# Patient Record
Sex: Male | Born: 1970
Health system: Southern US, Community
[De-identification: ages and names within clinical notes are randomized; demographics above are authoritative.]

## PROBLEM LIST (undated history)

## (undated) DIAGNOSIS — K219 Gastro-esophageal reflux disease without esophagitis: Secondary | ICD-10-CM

## (undated) DIAGNOSIS — E119 Type 2 diabetes mellitus without complications: Secondary | ICD-10-CM

## (undated) DIAGNOSIS — E785 Hyperlipidemia, unspecified: Secondary | ICD-10-CM

## (undated) DIAGNOSIS — Z79899 Other long term (current) drug therapy: Secondary | ICD-10-CM

## (undated) DIAGNOSIS — E559 Vitamin D deficiency, unspecified: Secondary | ICD-10-CM

## (undated) DIAGNOSIS — I10 Essential (primary) hypertension: Secondary | ICD-10-CM

## (undated) HISTORY — DX: Gastro-esophageal reflux disease without esophagitis: K21.9

## (undated) HISTORY — DX: Other long term (current) drug therapy: Z79.899

## (undated) HISTORY — DX: Hyperlipidemia, unspecified: E78.5

## (undated) HISTORY — DX: Vitamin D deficiency, unspecified: E55.9

## (undated) HISTORY — DX: Essential (primary) hypertension: I10

## (undated) HISTORY — DX: Type 2 diabetes mellitus without complications: E11.9

---

## 2005-08-06 ENCOUNTER — Encounter: Admission: RE | Admit: 2005-08-06 | Discharge: 2005-09-03 | Payer: Self-pay | Admitting: Internal Medicine

## 2006-07-01 ENCOUNTER — Emergency Department (HOSPITAL_COMMUNITY): Admission: EM | Admit: 2006-07-01 | Discharge: 2006-07-02 | Payer: Self-pay | Admitting: Emergency Medicine

## 2007-04-22 HISTORY — PX: ANKLE SURGERY: SHX546

## 2007-09-25 ENCOUNTER — Observation Stay (HOSPITAL_COMMUNITY): Admission: EM | Admit: 2007-09-25 | Discharge: 2007-09-26 | Payer: Self-pay | Admitting: Emergency Medicine

## 2007-10-13 ENCOUNTER — Encounter: Admission: RE | Admit: 2007-10-13 | Discharge: 2008-01-11 | Payer: Self-pay | Admitting: Orthopedic Surgery
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
## 2011-01-16 LAB — POCT I-STAT, CHEM 8
BUN: 9
Calcium, Ion: 1.13
Chloride: 103
Creatinine, Ser: 1.4
Glucose, Bld: 166 — ABNORMAL HIGH
HCT: 44
Hemoglobin: 15
Potassium: 4.1
Sodium: 140
TCO2: 24

## 2011-07-09 ENCOUNTER — Other Ambulatory Visit: Payer: Self-pay

## 2011-07-09 ENCOUNTER — Emergency Department (HOSPITAL_COMMUNITY)
Admission: EM | Admit: 2011-07-09 | Discharge: 2011-07-10 | Disposition: A | Discharge status: Home Health | Payer: 59 | Attending: Emergency Medicine | Admitting: Emergency Medicine

## 2011-07-09 ENCOUNTER — Encounter (HOSPITAL_COMMUNITY): Payer: Self-pay | Admitting: *Deleted

## 2011-07-09 DIAGNOSIS — R12 Heartburn: Secondary | ICD-10-CM | POA: Insufficient documentation

## 2011-07-09 DIAGNOSIS — R110 Nausea: Secondary | ICD-10-CM | POA: Insufficient documentation

## 2011-07-09 DIAGNOSIS — R079 Chest pain, unspecified: Secondary | ICD-10-CM | POA: Insufficient documentation

## 2011-07-09 DIAGNOSIS — R1013 Epigastric pain: Secondary | ICD-10-CM | POA: Insufficient documentation

## 2011-07-10 ENCOUNTER — Emergency Department (HOSPITAL_COMMUNITY): Payer: 59

## 2011-07-10 LAB — CBC
MCH: 28.8 pg (ref 26.0–34.0)
MCV: 84.5 fL (ref 78.0–100.0)
Platelets: 280 10*3/uL (ref 150–400)
RBC: 5.34 MIL/uL (ref 4.22–5.81)
RDW: 12.5 % (ref 11.5–15.5)
WBC: 8.4 10*3/uL (ref 4.0–10.5)

## 2011-07-10 LAB — DIFFERENTIAL
Basophils Absolute: 0 10*3/uL (ref 0.0–0.1)
Basophils Relative: 1 % (ref 0–1)
Eosinophils Absolute: 0.1 10*3/uL (ref 0.0–0.7)
Eosinophils Relative: 1 % (ref 0–5)
Lymphs Abs: 3.5 10*3/uL (ref 0.7–4.0)
Neutrophils Relative %: 52 % (ref 43–77)

## 2011-07-10 LAB — COMPREHENSIVE METABOLIC PANEL
ALT: 49 U/L (ref 0–53)
AST: 31 U/L (ref 0–37)
Albumin: 4.4 g/dL (ref 3.5–5.2)
Alkaline Phosphatase: 55 U/L (ref 39–117)
Calcium: 9.8 mg/dL (ref 8.4–10.5)
GFR calc Af Amer: >90 mL/min (ref >90)
Potassium: 3.1 mEq/L — ABNORMAL LOW (ref 3.5–5.1)
Sodium: 137 mEq/L (ref 135–145)
Total Protein: 8.1 g/dL (ref 6.0–8.3)

## 2011-07-10 NOTE — ED Notes (Signed)
Patient transported to Ultrasound 

## 2011-07-10 NOTE — ED Provider Notes (Signed)
History     CSN: [id number]  Arrival date & time 07/09/11  2120   First MD Initiated Contact with Patient 07/10/11 0305      Chief Complaint  Patient presents with  . Abdominal Pain    pt c/o epigastric pain that comes and goes, reports recent diagnosis of acid reflux and no relief with home medications.   . Chest Pain    (Consider location/radiation/quality/duration/timing/severity/associated sxs/prior treatment) HPI Comments: Was given prilosec but not helping.    Heart cath about 5 years ago normal.  Patient is a 40 y.o. male presenting with abdominal pain and chest pain. The history is provided by the patient.  Abdominal Pain The primary symptoms of the illness include abdominal pain and nausea. The primary symptoms of the illness do not include fever, vomiting or diarrhea. Episode onset: 2 1/2 weeks ago. The onset of the illness was gradual. The problem has been gradually worsening.  The patient has not had a change in bowel habit. Additional symptoms associated with the illness include heartburn. Symptoms associated with the illness do not include chills.  Chest Pain Primary symptoms include abdominal pain and nausea. Pertinent negatives for primary symptoms include no fever and no vomiting.     History reviewed. No pertinent past medical history.  History reviewed. No pertinent past surgical history.  History reviewed. No pertinent family history.  History  Substance Use Topics  . Smoking status: Never Smoker   . Smokeless tobacco: Not on file  . Alcohol Use: Yes      Review of Systems  Constitutional: Negative for fever and chills.  Cardiovascular: Positive for chest pain.  Gastrointestinal: Positive for heartburn, nausea and abdominal pain. Negative for vomiting and diarrhea.  All other systems reviewed and are negative.    Allergies  Review of patient's allergies indicates no known allergies.  Home Medications   Current Outpatient Rx  Name Route  Sig Dispense Refill  . OMEPRAZOLE 20 MG PO CPDR Oral Take 20 mg by mouth daily.      BP 188/95  Pulse 82  Temp(Src) 98.2 F (36.8 C) (Oral)  Resp 18  Wt 200 lb (90.719 kg)  SpO2 99%  Physical Exam  Nursing note and vitals reviewed. Constitutional: He is oriented to person, place, and time. He appears well-developed and well-nourished. No distress.  HENT:  Head: Normocephalic and atraumatic.  Neck: Normal range of motion. Neck supple.  Cardiovascular: Normal rate and regular rhythm.   No murmur heard. Pulmonary/Chest: Effort normal and breath sounds normal. No respiratory distress.  Abdominal: Soft. Bowel sounds are normal. He exhibits no distension. There is no tenderness.  Musculoskeletal: Normal range of motion.  Neurological: He is alert and oriented to person, place, and time.  Skin: Skin is warm and dry. He is not diaphoretic.    ED Course  Procedures (including critical care time)   Labs Reviewed  TROPONIN I  CBC  DIFFERENTIAL  COMPREHENSIVE METABOLIC PANEL  LIPASE, BLOOD   No results found.   No diagnosis found.    MDM  The labs and ekg and [place] look okay.  I am unsure as to the nature of his symptoms, but does appear emergent.  He had a normal heart cath a few years ago and the symptoms do not sound cardiac.  I will discharge the patient to home.  To return prn.        Geoffery Lyons, MD 07/10/11 [phone]

## 2011-07-10 NOTE — Discharge Instructions (Signed)

## 2013-02-21 ENCOUNTER — Ambulatory Visit: Payer: Self-pay | Admitting: Podiatry

## 2013-06-20 ENCOUNTER — Encounter: Payer: 59 | Admitting: Internal Medicine

## 2013-06-20 ENCOUNTER — Ambulatory Visit: Payer: Self-pay | Admitting: Physician Assistant

## 2013-06-24 ENCOUNTER — Encounter: Payer: Self-pay | Admitting: Physician Assistant

## 2013-06-24 ENCOUNTER — Ambulatory Visit (INDEPENDENT_AMBULATORY_CARE_PROVIDER_SITE_OTHER): Payer: 59 | Admitting: Physician Assistant

## 2013-06-24 VITALS — BP 142/82 | HR 88 | Temp 97.7°F | Resp 16 | Ht 69.0 in | Wt 206.0 lb

## 2013-06-24 DIAGNOSIS — E785 Hyperlipidemia, unspecified: Secondary | ICD-10-CM

## 2013-06-24 DIAGNOSIS — Z79899 Other long term (current) drug therapy: Secondary | ICD-10-CM | POA: Insufficient documentation

## 2013-06-24 DIAGNOSIS — I10 Essential (primary) hypertension: Secondary | ICD-10-CM | POA: Insufficient documentation

## 2013-06-24 DIAGNOSIS — E559 Vitamin D deficiency, unspecified: Secondary | ICD-10-CM

## 2013-06-24 DIAGNOSIS — E119 Type 2 diabetes mellitus without complications: Secondary | ICD-10-CM

## 2013-06-24 DIAGNOSIS — K219 Gastro-esophageal reflux disease without esophagitis: Secondary | ICD-10-CM | POA: Insufficient documentation

## 2013-06-24 LAB — HEMOGLOBIN A1C
Hgb A1c MFr Bld: 10.1 % — ABNORMAL HIGH (ref <5.7)
Mean Plasma Glucose: 243 mg/dL — ABNORMAL HIGH (ref <117)

## 2013-06-24 LAB — CBC WITH DIFFERENTIAL/PLATELET
BASOS ABS: 0 10*3/uL (ref 0.0–0.1)
BASOS PCT: 0 % (ref 0–1)
EOS PCT: 0 % (ref 0–5)
Eosinophils Absolute: 0 10*3/uL (ref 0.0–0.7)
HCT: 43.5 % (ref 39.0–52.0)
Hemoglobin: 15.3 g/dL (ref 13.0–17.0)
LYMPHS PCT: 32 % (ref 12–46)
Lymphs Abs: 2.5 10*3/uL (ref 0.7–4.0)
MCH: 30.1 pg (ref 26.0–34.0)
MCHC: 35.2 g/dL (ref 30.0–36.0)
MCV: 85.5 fL (ref 78.0–100.0)
Monocytes Absolute: 0.5 10*3/uL (ref 0.1–1.0)
Monocytes Relative: 6 % (ref 3–12)
NEUTROS ABS: 4.9 10*3/uL (ref 1.7–7.7)
Neutrophils Relative %: 62 % (ref 43–77)
PLATELETS: 229 10*3/uL (ref 150–400)
RBC: 5.09 MIL/uL (ref 4.22–5.81)
RDW: 13.1 % (ref 11.5–15.5)
WBC: 7.9 10*3/uL (ref 4.0–10.5)

## 2013-06-24 MED ORDER — METFORMIN HCL ER 500 MG PO TB24: 1000.0000 mg | ORAL_TABLET | Freq: Two times a day (BID) | ORAL | Status: DC

## 2013-06-24 MED ORDER — ENALAPRIL MALEATE 20 MG PO TABS: 20.0000 mg | ORAL_TABLET | Freq: Every day | ORAL | Status: DC

## 2013-06-24 NOTE — Progress Notes (Signed)
HPI 43 y.o. male  presents for over due  follow up with hypertension, hyperlipidemia, diabetes and vitamin D. He has been noncompliant with his follow ups.  His blood pressure has not been controlled at home, today their BP is BP: 142/82 mmHg - did not take BP med this AM  He does not workout. He denies chest pain, shortness of breath, dizziness.  He is not on cholesterol medication and denies myalgias. His cholesterol is not at goal. The cholesterol last visit was: LDL 144  He has not been working on diet and exercise for Diabetes, he uses his mothers glucometer, 3 weeks ago 195, and denies foot ulcerations, hypoglycemia , increased appetite, nausea, polydipsia and but states he has had polyuria and some blurred vision.. Last A1C in the office was: 9.2 Patient is on Vitamin D supplement.    Current Medications:  Current Outpatient Prescriptions on File Prior to Visit  Medication Sig Dispense Refill  . omeprazole (PRILOSEC) 20 MG capsule Take 20 mg by mouth daily.       No current facility-administered medications on file prior to visit.   Medical History:  Past Medical History  Diagnosis Date  . Hypertension   . Hyperlipidemia   . Diabetes mellitus without complication   . GERD (gastroesophageal reflux disease)    Allergies: No Known Allergies   Review of Systems: [X]  = complains of  [ ]  = denies  General: Fatigue [ ]  Fever [ ]  Chills [ ]  Weakness [ ]   Insomnia [ ]  Eyes: Redness [ ]  Blurred vision [ ]  Diplopia [ ]   ENT: Congestion [ ]  Sinus Pain [ ]  Post Nasal Drip [ ]  Sore Throat [ ]  Earache [ ]   Cardiac: Chest pain/pressure [ ]  SOB [ ]  Orthopnea [ ]   Palpitations [ ]   Paroxysmal nocturnal dyspnea[ ]  Claudication [ ]  Edema [ ]   Pulmonary: Cough [ ]  Wheezing[ ]   SOB [ ]   Snoring [ ]   GI: Nausea [ ]  Vomiting[ ]  Dysphagia[ ]  Heartburn[ ]  Abdominal pain [ ]  Constipation [ ] ; Diarrhea [ ] ; BRBPR [ ]  Melena[ ]  GU: Hematuria[ ]  Dysuria [ ]  Nocturia[ ]  Urgency [ ]   Hesitancy [ ]  Discharge  [ ]  Neuro: Headaches[ ]  Vertigo[ ]  Paresthesias[ ]  Spasm [ ]  Speech changes [ ]  Incoordination [ ]   Ortho: Arthritis [ ]  Joint pain [ ]  Muscle pain [ ]  Joint swelling [ ]  Back Pain [ ]  Skin:  Rash [ ]   Pruritis [ ]  Change in skin lesion [ ]   Psych: Depression[ ]  Anxiety[ ]  Confusion [ ]  Memory loss [ ]   Heme/Lypmh: Bleeding [ ]  Bruising [ ]  Enlarged lymph nodes [ ]   Endocrine: Visual blurring Arly.Keller ] Paresthesia [ ]  Polyuria Arly.Keller ] Polydypsea [ ]    Heat/cold intolerance [ ]  Hypoglycemia [ ]   Family history- Review and unchanged Social history- Review and unchanged Physical Exam: Filed Vitals:   06/24/13 1048  BP: 142/82  Pulse: 88  Temp: 97.7 F (36.5 C)  Resp: 16   Wt Readings from Last 3 Encounters:  06/24/13 206 lb (93.441 kg)  07/09/11 200 lb (90.719 kg)   General Appearance: Well nourished, in no apparent distress. Eyes: PERRLA, EOMs, conjunctiva no swelling or erythema Sinuses: No Frontal/maxillary tenderness ENT/Mouth: Ext aud canals clear, TMs without erythema, bulging. No erythema, swelling, or exudate on post pharynx.  Tonsils not swollen or erythematous. Hearing normal.  Neck: Supple, thyroid normal.  Respiratory: Respiratory effort normal, BS equal bilaterally without rales, rhonchi,  wheezing or stridor.  Cardio: RRR with no MRGs. Brisk peripheral pulses without edema.  Abdomen: Soft, + BS.  Non tender, no guarding, rebound, hernias, masses. Lymphatics: Non tender without lymphadenopathy.  Musculoskeletal: Full ROM, 5/5 strength, normal gait.  Skin: Warm, dry without rashes, lesions, ecchymosis.  Neuro: Cranial nerves intact. No cerebellar symptoms. Sensation intact.  Psych: Awake and oriented X 3, normal affect, Insight and Judgment appropriate.   Assessment and Plan:  Hypertension: Restart medication, enalapril, monitor blood pressure at home. Continue DASH diet. Cholesterol: Continue diet and exercise. Check cholesterol. May need medications, will check level first,  goal 70. Diabetes-Start diet and exercise. Check A1C, restart MF, rules given and will likely need another medication.Discussed general issues about diabetes pathophysiology and management., Educational material distributed., Suggested low cholesterol diet., Encouraged aerobic exercise., Discussed foot care., Reminded to get yearly retinal exam. Given freestyle lite meter and scripts for strips.  Vitamin D Def- check level and continue medications.   Follow up in one month Continue diet and meds as discussed. Further disposition pending results of labs. Discussed med's effects and SE's.  OVER 40 minutes of exam, counseling, chart review   Quentin MullingCollier, Dax Murguia 11:11 AM

## 2013-06-24 NOTE — Patient Instructions (Addendum)
   Bad carbs also include fruit juice, alcohol, and sweet tea. These are empty calories that do not signal to your brain that you are full.   Please remember the good carbs are still carbs which convert into sugar. So please measure them out no more than 1/2-1 cup of rice, oatmeal, pasta, and beans.  Veggies are however free foods! Pile them on.   I like lean protein at every meal such as chicken, Malaw[place]urkey, pork chops, cottage cheese, etc. Just do not fry these meats and please center your meal around vegetable, the meats should be a side dish.   No all fruit is created equal. Please see the list below, the fruit at the bottom is higher in sugars than the fruit at the top   We want weight loss that will last so you should lose 1-2 pounds a week.  THAT IS IT! Please pick THREE things a month to change. Once it is a habit check off the item. Then pick another three items off the list to become habits.  If you are already doing a habit on the list GREAT!  Cross that item off! o Don't drink your calories. Ie, alcohol, soda, fruit juice, and sweet tea.  o Drink more water. Drink a glass when you feel hungry or before each meal.  o Eat breakfast - Complex carb and protein (likeDannon light and fit yogurt, oatmeal, fruit, eggs, [place]urkey bacon). o Measure your cereal.  Eat no more than one cup a day. (ie [place]ashi) o Eat an apple a day. o Add a vegetable a day. o Try a new vegetable a month. o Use Pam! Stop using oil or butter to cook. o Don't finish your plate or use smaller plates. o Share your dessert. o Eat sugar free Jello for dessert or frozen grapes. o Don't eat 2-3 hours before bed. o Switch to whole wheat bread, pasta, and brown rice. o Make healthier choices when you eat out. No fries! o Pick baked chicken, NOT fried. o Don't forget to SLOW DOWN when you eat. It is not going anywhere.  o Take the stairs. o Park far away in the parking lot o [employer] Farm[employer] soup cans (or weights) for 10 minutes while  watching TV. o Walk at work for 10 minutes during break. o Walk outside 1 time a week with your friend, kids, dog, or significant other. o Start a walking group at church. o Walk the mall as much as you can tolerate.  o Keep a food diary. o Weigh yourself daily. o Walk for 15 minutes 3 days per week. o Cook at home more often and eat out less.  If life happens and you go back to old habits, it is okay.  Just start over. You can do it!   If you experience chest pain, get short of breath, or tired during the exercise, please stop immediately and inform your doctor.   Your LDL is not in range. Your LDL is the bad cholesterol that can lead to heart attack and stroke. To lower your number you can decrease your fatty foods, red meat, cheese, milk and increase fiber like whole grains and veggies. You can also add a fiber supplement like Metamucil or Benefiber.

## 2013-06-25 LAB — LIPID PANEL
CHOL/HDL RATIO: 4.5 ratio
Cholesterol: 210 mg/dL — ABNORMAL HIGH (ref 0–200)
HDL: 47 mg/dL (ref >39)
LDL Cholesterol: 135 mg/dL — ABNORMAL HIGH (ref 0–99)
TRIGLYCERIDES: 141 mg/dL (ref <150)
VLDL: 28 mg/dL (ref 0–40)

## 2013-06-25 LAB — BASIC METABOLIC PANEL WITH GFR
BUN: 12 mg/dL (ref 6–23)
CHLORIDE: 97 meq/L (ref 96–112)
CO2: 28 mEq/L (ref 19–32)
CREATININE: 1 mg/dL (ref 0.50–1.35)
Calcium: 9.8 mg/dL (ref 8.4–10.5)
Glucose, Bld: 250 mg/dL — ABNORMAL HIGH (ref 70–99)
POTASSIUM: 4.3 meq/L (ref 3.5–5.3)
Sodium: 137 mEq/L (ref 135–145)

## 2013-06-25 LAB — TSH: TSH: 1.188 u[IU]/mL (ref 0.350–4.500)

## 2013-06-25 LAB — MAGNESIUM: MAGNESIUM: 1.8 mg/dL (ref 1.5–2.5)

## 2013-06-25 LAB — HEPATIC FUNCTION PANEL
ALBUMIN: 4.5 g/dL (ref 3.5–5.2)
ALK PHOS: 50 U/L (ref 39–117)
ALT: 35 U/L (ref 0–53)
AST: 23 U/L (ref 0–37)
BILIRUBIN TOTAL: 0.7 mg/dL (ref 0.2–1.2)
Bilirubin, Direct: 0.1 mg/dL (ref 0.0–0.3)
Indirect Bilirubin: 0.6 mg/dL (ref 0.2–1.2)
Total Protein: 7.6 g/dL (ref 6.0–8.3)

## 2013-06-25 LAB — INSULIN, FASTING: INSULIN FASTING, SERUM: 7 u[IU]/mL (ref 3–28)

## 2013-06-25 LAB — VITAMIN D 25 HYDROXY (VIT D DEFICIENCY, FRACTURES): VIT D 25 HYDROXY: 22 ng/mL — AB (ref 30–89)

## 2013-06-27 ENCOUNTER — Other Ambulatory Visit: Payer: Self-pay

## 2013-06-27 MED ORDER — SIMVASTATIN 40 MG PO TABS: 40.0000 mg | ORAL_TABLET | Freq: Every day | ORAL | Status: DC

## 2013-07-29 ENCOUNTER — Ambulatory Visit: Payer: Self-pay | Admitting: Physician Assistant

## 2013-10-16 ENCOUNTER — Other Ambulatory Visit: Payer: Self-pay | Admitting: Physician Assistant

## 2013-12-17 ENCOUNTER — Other Ambulatory Visit: Payer: Self-pay | Admitting: Emergency Medicine

## 2014-03-15 ENCOUNTER — Ambulatory Visit: Payer: Self-pay | Admitting: Physician Assistant

## 2014-04-18 ENCOUNTER — Ambulatory Visit: Payer: Self-pay | Admitting: Physician Assistant

## 2014-05-20 ENCOUNTER — Ambulatory Visit (INDEPENDENT_AMBULATORY_CARE_PROVIDER_SITE_OTHER): Payer: 59 | Admitting: Family Medicine

## 2014-05-20 ENCOUNTER — Ambulatory Visit (INDEPENDENT_AMBULATORY_CARE_PROVIDER_SITE_OTHER): Payer: 59

## 2014-05-20 VITALS — BP 120/86 | HR 87 | Temp 98.4°F | Resp 16 | Ht 69.0 in | Wt 202.4 lb

## 2014-05-20 DIAGNOSIS — M7989 Other specified soft tissue disorders: Secondary | ICD-10-CM

## 2014-05-20 DIAGNOSIS — M79642 Pain in left hand: Secondary | ICD-10-CM

## 2014-05-20 DIAGNOSIS — S62309A Unspecified fracture of unspecified metacarpal bone, initial encounter for closed fracture: Secondary | ICD-10-CM

## 2014-05-20 MED ORDER — MELOXICAM 15 MG PO TABS: 7.5000 mg | ORAL_TABLET | Freq: Every day | ORAL | Status: DC
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
## 2014-05-20 NOTE — Patient Instructions (Signed)
RICE: Routine Care for Injuries The routine care of many injuries includes Rest, Ice, Compression, and Elevation (RICE). HOME CARE INSTRUCTIONS  Rest is needed to allow your body to heal. Routine activities can usually be resumed when comfortable. Injured tendons and bones can take up to 6 weeks to heal. Tendons are the cord-like structures that attach muscle to bone.  Ice following an injury helps keep the swelling down and reduces pain.  Put ice in a plastic bag.  Place a towel between your skin and the bag.  Leave the ice on for 15-20 minutes, 3-4 times a day, or as directed by your health care provider. Do this while awake, for the first 24 to 48 hours. After that, continue as directed by your caregiver.  Compression helps keep swelling down. It also gives support and helps with discomfort. If an elastic bandage has been applied, it should be removed and reapplied every 3 to 4 hours. It should not be applied tightly, but firmly enough to keep swelling down. Watch fingers or toes for swelling, bluish discoloration, coldness, numbness, or excessive pain. If any of these problems occur, remove the bandage and reapply loosely. Contact your caregiver if these problems continue.  Elevation helps reduce swelling and decreases pain. With extremities, such as the arms, hands, legs, and feet, the injured area should be placed near or above the level of the heart, if possible. SEEK IMMEDIATE MEDICAL CARE IF:  You have persistent pain and swelling.  You develop redness, numbness, or unexpected weakness.  Your symptoms are getting worse rather than improving after several days. These symptoms may indicate that further evaluation or further X-rays are needed. Sometimes, X-rays may not show a small broken bone (fracture) until 1 week or 10 days later. Make a follow-up appointment with your caregiver. Ask when your X-ray results will be ready. Make sure you get your X-ray results. Document Released:  07/20/2000 Document Revised: 04/12/2013 Document Reviewed: 09/06/2010 Exit[employer] Patient Information 2015 [employer]. This information is not intended to replace advice given to you by your health care provider. Make sure you discuss any questions you have with your health care provider.  

## 2014-05-24 ENCOUNTER — Encounter (HOSPITAL_BASED_OUTPATIENT_CLINIC_OR_DEPARTMENT_OTHER): Payer: Self-pay | Admitting: *Deleted

## 2014-05-24 NOTE — Progress Notes (Signed)
Will need istat ekg-denies cardiac or resp problems

## 2014-05-25 ENCOUNTER — Ambulatory Visit (HOSPITAL_BASED_OUTPATIENT_CLINIC_OR_DEPARTMENT_OTHER)
Admission: RE | Admit: 2014-05-25 | Discharge: 2014-05-25 | Disposition: A | Discharge status: Home / Self Care | Payer: 59 | Source: Ambulatory Visit | Attending: Orthopedic Surgery | Admitting: Orthopedic Surgery

## 2014-05-25 ENCOUNTER — Ambulatory Visit (HOSPITAL_BASED_OUTPATIENT_CLINIC_OR_DEPARTMENT_OTHER): Payer: 59 | Admitting: Anesthesiology

## 2014-05-25 ENCOUNTER — Other Ambulatory Visit: Payer: Self-pay | Admitting: Orthopedic Surgery

## 2014-05-25 ENCOUNTER — Encounter (HOSPITAL_BASED_OUTPATIENT_CLINIC_OR_DEPARTMENT_OTHER)
Admission: RE | Disposition: A | Discharge status: Home / Self Care | Payer: Self-pay | Source: Ambulatory Visit | Attending: Orthopedic Surgery

## 2014-05-25 ENCOUNTER — Encounter (HOSPITAL_BASED_OUTPATIENT_CLINIC_OR_DEPARTMENT_OTHER): Payer: Self-pay | Admitting: *Deleted

## 2014-05-25 DIAGNOSIS — X58XXXA Exposure to other specified factors, initial encounter: Secondary | ICD-10-CM | POA: Insufficient documentation

## 2014-05-25 DIAGNOSIS — Y999 Unspecified external cause status: Secondary | ICD-10-CM | POA: Diagnosis not present

## 2014-05-25 DIAGNOSIS — I10 Essential (primary) hypertension: Secondary | ICD-10-CM | POA: Diagnosis not present

## 2014-05-25 DIAGNOSIS — K219 Gastro-esophageal reflux disease without esophagitis: Secondary | ICD-10-CM | POA: Insufficient documentation

## 2014-05-25 DIAGNOSIS — E785 Hyperlipidemia, unspecified: Secondary | ICD-10-CM | POA: Insufficient documentation

## 2014-05-25 DIAGNOSIS — E119 Type 2 diabetes mellitus without complications: Secondary | ICD-10-CM | POA: Diagnosis not present

## 2014-05-25 DIAGNOSIS — Y9389 Activity, other specified: Secondary | ICD-10-CM | POA: Insufficient documentation

## 2014-05-25 DIAGNOSIS — S62305A Unspecified fracture of fourth metacarpal bone, left hand, initial encounter for closed fracture: Secondary | ICD-10-CM | POA: Insufficient documentation

## 2014-05-25 DIAGNOSIS — Y929 Unspecified place or not applicable: Secondary | ICD-10-CM | POA: Diagnosis not present

## 2014-05-25 DIAGNOSIS — Z79899 Other long term (current) drug therapy: Secondary | ICD-10-CM | POA: Insufficient documentation

## 2014-05-25 HISTORY — PX: OPEN REDUCTION INTERNAL FIXATION (ORIF) METACARPAL: SHX6234

## 2014-05-25 LAB — POCT I-STAT, CHEM 8
BUN: 15 mg/dL (ref 6–23)
CALCIUM ION: 1.13 mmol/L (ref 1.12–1.23)
CHLORIDE: 101 mmol/L (ref 96–112)
Creatinine, Ser: 1 mg/dL (ref 0.50–1.35)
GLUCOSE: 244 mg/dL — AB (ref 70–99)
HEMATOCRIT: 49 % (ref 39.0–52.0)
Hemoglobin: 16.7 g/dL (ref 13.0–17.0)
Potassium: 3.6 mmol/L (ref 3.5–5.1)
SODIUM: 139 mmol/L (ref 135–145)
TCO2: 24 mmol/L (ref 0–100)

## 2014-05-25 LAB — GLUCOSE, CAPILLARY: GLUCOSE-CAPILLARY: 230 mg/dL — AB (ref 70–99)

## 2014-05-25 SURGERY — OPEN REDUCTION INTERNAL FIXATION (ORIF) METACARPAL
Anesthesia: General | Site: Finger | Laterality: Left

## 2014-05-25 MED ORDER — DEXAMETHASONE SODIUM PHOSPHATE 10 MG/ML IJ SOLN
INTRAMUSCULAR | Status: DC | PRN
  Administered 2014-05-25: 4 mg via INTRAVENOUS

## 2014-05-25 MED ORDER — OXYCODONE HCL 5 MG PO TABS: 5.0000 mg | ORAL_TABLET | Freq: Once | ORAL | Status: DC | PRN

## 2014-05-25 MED ORDER — MIDAZOLAM HCL 2 MG/2ML IJ SOLN
1.0000 mg | INTRAMUSCULAR | Status: DC | PRN
  Administered 2014-05-25: 2 mg via INTRAVENOUS

## 2014-05-25 MED ORDER — FENTANYL CITRATE 0.05 MG/ML IJ SOLN
50.0000 ug | INTRAMUSCULAR | Status: DC | PRN
  Administered 2014-05-25: 100 ug via INTRAVENOUS

## 2014-05-25 MED ORDER — OXYCODONE HCL 5 MG/5ML PO SOLN: 5.0000 mg | Freq: Once | ORAL | Status: DC | PRN

## 2014-05-25 MED ORDER — HYDROMORPHONE HCL 1 MG/ML IJ SOLN: 0.2500 mg | INTRAMUSCULAR | Status: DC | PRN

## 2014-05-25 MED ORDER — CHLORHEXIDINE GLUCONATE 4 % EX LIQD: 60.0000 mL | Freq: Once | CUTANEOUS | Status: DC

## 2014-05-25 MED ORDER — CEFAZOLIN SODIUM 1-5 GM-% IV SOLN
INTRAVENOUS | Status: AC
  Filled 2014-05-25: qty 100

## 2014-05-25 MED ORDER — ONDANSETRON HCL 4 MG/2ML IJ SOLN
INTRAMUSCULAR | Status: DC | PRN
  Administered 2014-05-25: 4 mg via INTRAVENOUS

## 2014-05-25 MED ORDER — MIDAZOLAM HCL 2 MG/2ML IJ SOLN
INTRAMUSCULAR | Status: AC
  Filled 2014-05-25: qty 2

## 2014-05-25 MED ORDER — CEFAZOLIN SODIUM-DEXTROSE 2-3 GM-% IV SOLR
2.0000 g | INTRAVENOUS | Status: AC
  Administered 2014-05-25: 2 g via INTRAVENOUS

## 2014-05-25 MED ORDER — HYDROCODONE-ACETAMINOPHEN 5-325 MG PO TABS: 1.0000 | ORAL_TABLET | Freq: Four times a day (QID) | ORAL | Status: DC | PRN

## 2014-05-25 MED ORDER — BUPIVACAINE-EPINEPHRINE (PF) 0.5% -1:200000 IJ SOLN
INTRAMUSCULAR | Status: DC | PRN
  Administered 2014-05-25: 25 mL via PERINEURAL

## 2014-05-25 MED ORDER — FENTANYL CITRATE 0.05 MG/ML IJ SOLN
INTRAMUSCULAR | Status: AC
  Filled 2014-05-25: qty 2

## 2014-05-25 MED ORDER — MIDAZOLAM HCL 5 MG/5ML IJ SOLN
INTRAMUSCULAR | Status: DC | PRN
  Administered 2014-05-25: 2 mg via INTRAVENOUS

## 2014-05-25 MED ORDER — LIDOCAINE HCL (CARDIAC) 20 MG/ML IV SOLN
INTRAVENOUS | Status: DC | PRN
  Administered 2014-05-25: 30 mg via INTRAVENOUS

## 2014-05-25 MED ORDER — FENTANYL CITRATE 0.05 MG/ML IJ SOLN
INTRAMUSCULAR | Status: AC
  Filled 2014-05-25: qty 6

## 2014-05-25 MED ORDER — LACTATED RINGERS IV SOLN
INTRAVENOUS | Status: DC
Start: 2014-05-25 — End: 2014-05-25
  Administered 2014-05-25 (×2): via INTRAVENOUS

## 2014-05-25 MED ORDER — ONDANSETRON HCL 4 MG/2ML IJ SOLN: 4.0000 mg | Freq: Once | INTRAMUSCULAR | Status: DC | PRN

## 2014-05-25 MED ORDER — PROPOFOL 10 MG/ML IV BOLUS
INTRAVENOUS | Status: DC | PRN
  Administered 2014-05-25: 200 mg via INTRAVENOUS

## 2014-05-25 SURGICAL SUPPLY — 52 items
BLADE MINI RND TIP GREEN BEAV (BLADE) IMPLANT
BLADE SURG 15 STRL LF DISP TIS (BLADE) ×1 IMPLANT
BLADE SURG 15 STRL SS (BLADE) ×2
BNDG CMPR 9X4 STRL LF SNTH (GAUZE/BANDAGES/DRESSINGS) ×1
BNDG COHESIVE 3X5 TAN STRL LF (GAUZE/BANDAGES/DRESSINGS) ×2 IMPLANT
BNDG ESMARK 4X9 LF (GAUZE/BANDAGES/DRESSINGS) ×2 IMPLANT
BNDG GAUZE ELAST 4 BULKY (GAUZE/BANDAGES/DRESSINGS) IMPLANT
CHLORAPREP W/TINT 26ML (MISCELLANEOUS) ×2 IMPLANT
CORDS BIPOLAR (ELECTRODE) ×2 IMPLANT
COVER BACK TABLE 60X90IN (DRAPES) ×2 IMPLANT
COVER MAYO STAND STRL (DRAPES) ×2 IMPLANT
CUFF TOURNIQUET SINGLE 18IN (TOURNIQUET CUFF) ×1 IMPLANT
DECANTER SPIKE VIAL GLASS SM (MISCELLANEOUS) IMPLANT
DRAPE EXTREMITY T 121X128X90 (DRAPE) ×2 IMPLANT
DRAPE OEC MINIVIEW 54X84 (DRAPES) ×2 IMPLANT
DRAPE SURG 17X23 STRL (DRAPES) ×2 IMPLANT
GAUZE SPONGE 4X4 12PLY STRL (GAUZE/BANDAGES/DRESSINGS) ×2 IMPLANT
GAUZE XEROFORM 1X8 LF (GAUZE/BANDAGES/DRESSINGS) ×2 IMPLANT
GLOVE BIO SURGEON STRL SZ7.5 (GLOVE) ×1 IMPLANT
GLOVE BIOGEL PI IND STRL 7.0 (GLOVE) IMPLANT
GLOVE BIOGEL PI IND STRL 8 (GLOVE) IMPLANT
GLOVE BIOGEL PI IND STRL 8.5 (GLOVE) ×1 IMPLANT
GLOVE BIOGEL PI INDICATOR 7.0 (GLOVE) ×1
GLOVE BIOGEL PI INDICATOR 8 (GLOVE) ×1
GLOVE BIOGEL PI INDICATOR 8.5 (GLOVE) ×1
GLOVE ECLIPSE 6.5 STRL STRAW (GLOVE) ×2 IMPLANT
GLOVE SURG ORTHO 8.0 STRL STRW (GLOVE) ×2 IMPLANT
GOWN STRL REUS W/ TWL LRG LVL3 (GOWN DISPOSABLE) IMPLANT
GOWN STRL REUS W/TWL LRG LVL3 (GOWN DISPOSABLE) ×2
GOWN STRL REUS W/TWL XL LVL3 (GOWN DISPOSABLE) ×3 IMPLANT
NDL PRECISIONGLIDE 27X1.5 (NEEDLE) IMPLANT
NEEDLE PRECISIONGLIDE 27X1.5 (NEEDLE) IMPLANT
NS IRRIG 1000ML POUR BTL (IV SOLUTION) ×2 IMPLANT
PACK BASIN DAY SURGERY FS (CUSTOM PROCEDURE TRAY) ×2 IMPLANT
PAD CAST 3X4 CTTN HI CHSV (CAST SUPPLIES) ×1 IMPLANT
PADDING CAST ABS 4INX4YD NS (CAST SUPPLIES) ×1
PADDING CAST ABS COTTON 4X4 ST (CAST SUPPLIES) ×1 IMPLANT
PADDING CAST COTTON 3X4 STRL (CAST SUPPLIES) ×2
SCREW CORTEX 1.5X8 (Screw) ×2 IMPLANT
SCREW CORTEX 1.5X9 (Screw) ×1 IMPLANT
SLEEVE SCD COMPRESS KNEE MED (MISCELLANEOUS) ×1 IMPLANT
SPLINT PLASTER CAST XFAST 3X15 (CAST SUPPLIES) IMPLANT
SPLINT PLASTER XTRA FASTSET 3X (CAST SUPPLIES) ×5
STOCKINETTE 4X48 STRL (DRAPES) ×2 IMPLANT
SUT CHROMIC 5 0 P 3 (SUTURE) ×1 IMPLANT
SUT MERSILENE 4 0 P 3 (SUTURE) IMPLANT
SUT MNCRL AB 4-0 PS2 18 (SUTURE) ×2 IMPLANT
SUT VICRYL 4-0 PS2 18IN ABS (SUTURE) ×1 IMPLANT
SYR BULB 3OZ (MISCELLANEOUS) ×2 IMPLANT
SYR CONTROL 10ML LL (SYRINGE) IMPLANT
TOWEL OR 17X24 6PK STRL BLUE (TOWEL DISPOSABLE) ×2 IMPLANT
UNDERPAD 30X30 INCONTINENT (UNDERPADS AND DIAPERS) ×2 IMPLANT

## 2014-05-25 NOTE — Anesthesia Procedure Notes (Addendum)
Anesthesia Regional Block:  Supraclavicular block  Pre-Anesthetic Checklist: ,, timeout performed, Correct Patient, Correct Site, Correct Laterality, Correct Procedure, Correct Position, site marked, Risks and benefits discussed,  Surgical consent,  Pre-op evaluation,  At surgeon's request and post-op pain management  Laterality: Left and Upper  Prep: chloraprep       Needles:  Injection technique: Single-shot  Needle Type: Echogenic Stimulator Needle     Needle Length: 5cm 5 cm Needle Gauge: 21 and 21 G    Additional Needles:  Procedures: ultrasound guided (picture in chart) Supraclavicular block Narrative:  Start time: 05/25/2014 1:27 PM End time: 05/25/2014 1:32 PM Injection made incrementally with aspirations every 5 mL.  Performed by: Personally  Anesthesiologist: CREWS, DAVID A   Procedure Name: LMA Insertion Date/Time: 05/25/2014 1:58 PM Performed by: Lynanne Delgreco Pre-anesthesia Checklist: Patient identified, Emergency Drugs available, Suction available and Patient being monitored Patient Re-evaluated:Patient Re-evaluated prior to inductionOxygen Delivery Method: Circle System Utilized Preoxygenation: Pre-oxygenation with 100% oxygen Intubation Type: IV induction Ventilation: Mask ventilation without difficulty LMA: LMA inserted LMA Size: 5.0 Number of attempts: 1 Airway Equipment and Method: Bite block Placement Confirmation: positive ETCO2 Tube secured with: Tape Dental Injury: Teeth and Oropharynx as per pre-operative assessment

## 2014-05-25 NOTE — Discharge Instructions (Addendum)
[facility] Center Instructions °Hand Surgery ° °Wound Care: °Keep your hand elevated above the level of your heart.  Do not allow it to dangle by your side.  Keep the dressing dry and do not remove it unless your doctor advises you to do so.  He will usually change it at the time of your post-op visit.  Moving your fingers is advised to stimulate circulation but will depend on the site of your surgery.  If you have a splint applied, your doctor will advise you regarding movement. ° °Activity: °Do not drive or operate machinery today.  Rest today and then you may return to your normal activity and work as indicated by your physician. ° °Diet:  °Drink liquids today or eat a light diet.  You may resume a regular diet tomorrow.   ° °General expectations: °Pain for two to three days. °Fingers may become slightly swollen. ° °Call your doctor if any of the following occur: °Severe pain not relieved by pain medication. °Elevated temperature. °Dressing soaked with blood. °Inability to move fingers. °White or bluish color to fingers. ° ° °Post Anesthesia Home Care Instructions ° °Activity: °Get plenty of rest for the remainder of the day. A responsible adult should stay with you for 24 hours following the procedure.  °For the next 24 hours, DO NOT: °-Drive a car °-Operate machinery °-Drink alcoholic beverages °-Take any medication unless instructed by your physician °-Make any legal decisions or sign important papers. ° °Meals: °Start with liquid foods such as gelatin or soup. Progress to regular foods as tolerated. Avoid greasy, spicy, heavy foods. If nausea and/or vomiting occur, drink only clear liquids until the nausea and/or vomiting subsides. Call your physician if vomiting continues. ° °Special Instructions/Symptoms: °Your throat may feel dry or sore from the anesthesia or the breathing tube placed in your throat during surgery. If this causes discomfort, gargle with warm salt water. The discomfort should disappear within 24  hours. ° ° °Regional Anesthesia Blocks ° °1. Numbness or the inability to move the "blocked" extremity may last from 3-48 hours after placement. The length of time depends on the medication injected and your individual response to the medication. If the numbness is not going away after 48 hours, call your surgeon. ° °2. The extremity that is blocked will need to be protected until the numbness is gone and the  Strength has returned. Because you cannot feel it, you will need to take extra care to avoid injury. Because it may be weak, you may have difficulty moving it or using it. You may not know what position it is in without looking at it while the block is in effect. ° °3. For blocks in the legs and feet, returning to weight bearing and walking needs to be done carefully. You will need to wait until the numbness is entirely gone and the strength has returned. You should be able to move your leg and foot normally before you try and bear weight or walk. You will need someone to be with you when you first try to ensure you do not fall and possibly risk injury. ° °4. Bruising and tenderness at the needle site are common side effects and will resolve in a few days. ° °5. Persistent numbness or new problems with movement should be communicated to the surgeon or the [facility] ([phone])/ [facility] ([phone]). °

## 2014-05-25 NOTE — Progress Notes (Signed)
  Assisted Dr. Crews with left, ultrasound guided, supraclavicular block. Side rails up, monitors on throughout procedure. See vital signs in flow sheet. Tolerated Procedure well. 

## 2014-05-25 NOTE — Anesthesia Preprocedure Evaluation (Signed)
Anesthesia Evaluation  Patient identified by MRN, date of birth, ID band Patient awake    Reviewed: Allergy & Precautions, NPO status , Patient's Chart, lab work & pertinent test results  Airway Mallampati: I  TM Distance: >3 FB Neck ROM: Full    Dental  (+) Teeth Intact, Dental Advisory Given   Pulmonary  breath sounds clear to auscultation        Cardiovascular hypertension, Pt. on medications Rhythm:Regular Rate:Normal     Neuro/Psych    GI/Hepatic GERD-  Medicated and Controlled,  Endo/Other  diabetes, Well Controlled, Type 2, Oral Hypoglycemic Agents  Renal/GU      Musculoskeletal   Abdominal   Peds  Hematology   Anesthesia Other Findings   Reproductive/Obstetrics                             Anesthesia Physical Anesthesia Plan  ASA: II  Anesthesia Plan: General   Post-op Pain Management:    Induction: Intravenous  Airway Management Planned: LMA  Additional Equipment:   Intra-op Plan:   Post-operative Plan: Extubation in OR  Informed Consent: I have reviewed the patients History and Physical, chart, labs and discussed the procedure including the risks, benefits and alternatives for the proposed anesthesia with the patient or authorized representative who has indicated his/her understanding and acceptance.   Dental advisory given  Plan Discussed with: CRNA, Anesthesiologist and Surgeon  Anesthesia Plan Comments:         Anesthesia Quick Evaluation

## 2014-05-25 NOTE — Op Note (Signed)
Dictation Number [id number]

## 2014-05-25 NOTE — Transfer of Care (Signed)
Immediate Anesthesia Transfer of Care Note  Patient: William Moran  Procedure(s) Performed: Procedure(s): OPEN REDUCTION INTERNAL FIXATION (ORIF) LEFT RING FINGER METACARPAL (Left)  Patient Location: PACU  Anesthesia Type:GA combined with regional for post-op pain  Level of Consciousness: awake, alert , oriented and patient cooperative  Airway & Oxygen Therapy: Patient Spontanous Breathing and Patient connected to face mask oxygen  Post-op Assessment: Report given to RN and Post -op Vital signs reviewed and stable  Post vital signs: Reviewed and stable  Last Vitals:  Filed Vitals:   05/25/14 1335  BP: 135/84  Pulse: 81  Temp:   Resp: 17    Complications: No apparent anesthesia complications

## 2014-05-25 NOTE — Brief Op Note (Signed)
05/25/2014  2:46 PM  PATIENT:  Royetta CarBernard Macneal  44 y.o. male  PRE-OPERATIVE DIAGNOSIS:  FRACTURE LEFT RING METACARPAL  POST-OPERATIVE DIAGNOSIS:  FRACTURE LEFT RING METACARPAL  PROCEDURE:  Procedure(s): OPEN REDUCTION INTERNAL FIXATION (ORIF) LEFT RING FINGER METACARPAL (Left)  SURGEON:  Surgeon(s) and Role:    * Cindee SaltGary Antino Mayabb, MD - Primary    * Betha LoaKevin Lahoma Constantin, MD - Assisting  PHYSICIAN ASSISTANT:   ASSISTANTS: K Donata Reddick,MD   ANESTHESIA:   regional and general  EBL:  Total I/O In: 1300 [I.V.:1300] Out: -   BLOOD ADMINISTERED:none  DRAINS: none   LOCAL MEDICATIONS USED:  NONE  SPECIMEN:  No Specimen  DISPOSITION OF SPECIMEN:  N/A  COUNTS:  YES  TOURNIQUET:   Total Tourniquet Time Documented: Upper Arm (Left) - 30 minutes Total: Upper Arm (Left) - 30 minutes   DICTATION: .Other Dictation: Dictation Number [id number]  PLAN OF CARE: Discharge to home after PACU  PATIENT DISPOSITION:  PACU - hemodynamically stable.
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
## 2014-05-26 ENCOUNTER — Encounter (HOSPITAL_BASED_OUTPATIENT_CLINIC_OR_DEPARTMENT_OTHER): Payer: Self-pay | Admitting: Orthopedic Surgery

## 2014-05-26 NOTE — Anesthesia Postprocedure Evaluation (Signed)
  Anesthesia Post-op Note  Patient: William Moran  Procedure(s) Performed: Procedure(s): OPEN REDUCTION INTERNAL FIXATION (ORIF) LEFT RING FINGER METACARPAL (Left)  Patient Location: PACU  Anesthesia Type: Generalwith regional   Level of Consciousness: awake, alert  and oriented  Airway and Oxygen Therapy: Patient Spontanous Breathing  Post-op Pain: none  Post-op Assessment: Post-op Vital signs reviewed  Post-op Vital Signs: Reviewed  Last Vitals:  Filed Vitals:   05/25/14 1542  BP: 156/95  Pulse: 75  Temp: 36.7 C  Resp: 18    Complications: No apparent anesthesia complications

## 2016-09-23 DIAGNOSIS — E1165 Type 2 diabetes mellitus with hyperglycemia: Secondary | ICD-10-CM | POA: Diagnosis not present

## 2016-09-23 DIAGNOSIS — E782 Mixed hyperlipidemia: Secondary | ICD-10-CM | POA: Diagnosis not present

## 2016-12-31 DIAGNOSIS — E782 Mixed hyperlipidemia: Secondary | ICD-10-CM | POA: Diagnosis not present

## 2016-12-31 DIAGNOSIS — E1165 Type 2 diabetes mellitus with hyperglycemia: Secondary | ICD-10-CM | POA: Diagnosis not present

## 2017-03-09 ENCOUNTER — Ambulatory Visit (HOSPITAL_COMMUNITY)
Admission: EM | Admit: 2017-03-09 | Discharge: 2017-03-09 | Disposition: A | Discharge status: Home / Self Care | Payer: 59 | Attending: Family Medicine | Admitting: Family Medicine

## 2017-03-09 ENCOUNTER — Encounter (HOSPITAL_COMMUNITY): Payer: Self-pay | Admitting: Emergency Medicine

## 2017-03-09 ENCOUNTER — Other Ambulatory Visit: Payer: Self-pay

## 2017-03-09 DIAGNOSIS — R040 Epistaxis: Secondary | ICD-10-CM

## 2017-03-09 DIAGNOSIS — J069 Acute upper respiratory infection, unspecified: Secondary | ICD-10-CM

## 2017-03-09 NOTE — ED Provider Notes (Signed)
  [facility]   [id number] 03/09/17 Arrival Time: 1055  ASSESSMENT & PLAN:  1. Epistaxis   2. Viral upper respiratory tract infection     OTC symptom care as needed. Recommend Claritin and nasal saline spray. May f/u as needed.  Reviewed expectations re: course of current medical issues. Questions answered. Outlined signs and symptoms indicating need for more acute intervention. Patient verbalized understanding. After Visit Summary given.   SUBJECTIVE:  William Moran is a 46 y.o. male who presents with complaint of nasal congestion and post-nasal drainage. Blowing nose more frequently. Onset abrupt approximately 3-4 days ago. Overall fatigued. SOB: none. Wheezing: none. Today noticed bright red blood on tissue when he blew nose. No copious or continuous nose bleed reported. "Nose kind of burns." No specific aggravating or alleviating factors reported. No h/o recurrent sinus infections.  OTC treatment: None.  ROS: As per HPI.   OBJECTIVE:  Vitals:   03/09/17 1156  BP: (!) 145/87  Pulse: 94  Temp: 98.4 F (36.9 C)  TempSrc: Oral  SpO2: 100%     General appearance: alert; no distress HEENT: nasal congestion; clear runny nose; throat irritation secondary to post-nasal drainage; nasal mucosa very irritated without active bleeding Neck: supple without LAD Lungs: clear to auscultation bilaterally Skin: warm and dry Psychological: alert and cooperative; normal mood and affect  No Known Allergies  Past Medical History:  Diagnosis Date  . Diabetes mellitus without complication (HCC)   . Encounter for long-term (current) use of other medications   . GERD (gastroesophageal reflux disease)   . Hyperlipidemia   . Hypertension   . Vitamin D deficiency    Social History   Socioeconomic History  . Marital status: Single    Spouse name: Not on file  . Number of children: Not on file  . Years of education: Not on file  . Highest education level: Not on  file  Social Needs  . Financial resource strain: Not on file  . Food insecurity - worry: Not on file  . Food insecurity - inability: Not on file  . Transportation needs - medical: Not on file  . Transportation needs - non-medical: Not on file  Occupational History  . Not on file  Tobacco Use  . Smoking status: Never Smoker  . Smokeless tobacco: Never Used  Substance and Sexual Activity  . Alcohol use: Yes  . Drug use: No  . Sexual activity: Not on file  Other Topics Concern  . Not on file  Social History Narrative  . Not on file   Family History  Problem Relation Age of Onset  . Diabetes Mother            Mardella LaymanHagler, Druanne Bosques, MD 03/09/17 1236

## 2017-03-09 NOTE — Discharge Instructions (Signed)
You may use over the counter Claritin and any nasal saline spray.

## 2017-03-09 NOTE — ED Triage Notes (Signed)
Pt reports seeing blood on his kleenex when he blows his nose that started this morning.  Pt states he has been having trouble with his allergies over the last three days, with nasal congestion and drainage and post nasal drip.  He reports some burning in the nares and sinus cavities.

## 2017-10-12 DIAGNOSIS — E782 Mixed hyperlipidemia: Secondary | ICD-10-CM | POA: Diagnosis not present

## 2017-10-12 DIAGNOSIS — E1165 Type 2 diabetes mellitus with hyperglycemia: Secondary | ICD-10-CM | POA: Diagnosis not present

## 2017-10-19 DIAGNOSIS — E1165 Type 2 diabetes mellitus with hyperglycemia: Secondary | ICD-10-CM | POA: Diagnosis not present

## 2017-10-19 DIAGNOSIS — I10 Essential (primary) hypertension: Secondary | ICD-10-CM | POA: Diagnosis not present

## 2017-10-19 DIAGNOSIS — E782 Mixed hyperlipidemia: Secondary | ICD-10-CM | POA: Diagnosis not present

## 2017-10-19 DIAGNOSIS — Z0000 Encounter for general adult medical examination without abnormal findings: Secondary | ICD-10-CM | POA: Diagnosis not present

## 2018-01-26 ENCOUNTER — Other Ambulatory Visit: Payer: Self-pay | Admitting: Occupational Medicine

## 2018-01-26 ENCOUNTER — Ambulatory Visit: Payer: Self-pay

## 2018-01-26 DIAGNOSIS — M545 Low back pain, unspecified: Secondary | ICD-10-CM

## 2018-01-28 MED FILL — predniSONE 10 MG TABS: 10 | 12 days supply | Qty: 48 | Fill #0

## 2018-02-10 ENCOUNTER — Ambulatory Visit: Payer: PRIVATE HEALTH INSURANCE | Attending: Occupational Medicine | Admitting: Physical Therapy

## 2018-02-10 ENCOUNTER — Other Ambulatory Visit: Payer: Self-pay

## 2018-02-10 ENCOUNTER — Encounter: Payer: Self-pay | Admitting: Physical Therapy

## 2018-02-10 DIAGNOSIS — M545 Low back pain, unspecified: Secondary | ICD-10-CM

## 2018-02-10 NOTE — Therapy (Signed)
[facility] [address] [place], [place], [address] Phone: [phone]   Fax:  [phone]  Physical Therapy Evaluation  Patient Details  Name: William Moran MRN: [id number] Date of Birth: 03-May-1970 Referring Provider (PT): Lucia Gaskins, MD   Encounter Date: 02/10/2018  PT End of Session - 02/10/18 0934    Visit Number  1    Number of Visits  7    Authorization Type  [facility] 1 eval + 6 visits, Case Manager Idamae Lusher  **fax notes**    Authorization - Visit Number  1    Authorization - Number of Visits  7    PT Start Time  0930    PT Stop Time  1008    PT Time Calculation (min)  38 min    Activity Tolerance  Patient tolerated treatment well    Behavior During Therapy  [facility] for tasks assessed/performed       Past Medical History:  Diagnosis Date  . Diabetes mellitus without complication (HCC)   . Encounter for long-term (current) use of other medications   . GERD (gastroesophageal reflux disease)   . Hyperlipidemia   . Hypertension   . Vitamin D deficiency     Past Surgical History:  Procedure Laterality Date  . ANKLE SURGERY Right 2009  . OPEN REDUCTION INTERNAL FIXATION (ORIF) METACARPAL Left 05/25/2014   Procedure: OPEN REDUCTION INTERNAL FIXATION (ORIF) LEFT RING FINGER METACARPAL;  Surgeon: Cindee Salt, MD;  Location: [facility];  Service: Orthopedics;  Laterality: Left;    There were no vitals filed for this visit.   Subjective Assessment - 02/10/18 0938    Subjective  Having pain in Lt lower back to knee- the whole thigh. 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 Oct 1st was incident. Not working now, returns to MD on Oct 30.     Patient Stated Goals  sleep, work    Currently in Pain?  Yes    Pain Score  7     Pain Location  Leg    Pain Orientation  Right;Upper    Pain Descriptors / Indicators  Throbbing    Pain Type  Acute pain    Pain Radiating Towards  Lt knee     Pain Frequency  Intermittent    Aggravating Factors   laying down, being still    Pain Relieving Factors  moving around- shorter periods         [facility] PT Assessment - 02/10/18 0001      Assessment   Medical Diagnosis  LBP    Referring Provider (PT)  Lucia Gaskins, MD    Onset Date/Surgical Date  01/19/18    Hand Dominance  Left    Next MD Visit  10/30    Prior Therapy  no      Precautions   Precautions  None      Restrictions   Weight Bearing Restrictions  No      Balance Screen   Has the patient fallen in the past 6 months  No      Home Environment   Living Environment  Private residence      Prior Function   Level of Independence  Independent    Vocation  Full time employment    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      Cognition   Overall Cognitive Status  Within Functional Limits for tasks assessed      Observation/Other Assessments  Focus on Therapeutic Outcomes (FOTO)   46% limited      Sensation   Additional Comments  WFL      ROM / Strength   AROM / PROM / Strength  Strength      Strength   Strength Assessment Site  Hip    Right/Left Hip  Right;Left    Right Hip Flexion  5/5    Right Hip ABduction  5/5    Left Hip Flexion  4/5    Left Hip ABduction  4-/5      Flexibility   Soft Tissue Assessment /Muscle Length  yes    Hamstrings  50% on Lt vs Rt, concordant pulling      Palpation   Palpation comment  TTP gluts, piriformis, TFL on Lt                Objective measurements completed on examination: See above findings.      [facility] Adult PT Treatment/Exercise - 02/10/18 0001      Exercises   Exercises  Knee/Hip      Knee/Hip Exercises: Stretches   Passive Hamstring Stretch Limitations  supine with strap    Quad Stretch Limitations  standing    Hip Flexor Stretch Limitations  modified thomas    Piriformis Stretch Limitations  figure 4    Gastroc Stretch Limitations  standing             PT Education - 02/10/18  0949    Education Details  tennis ball STM, anatomy of condition, POC, HEP, exercise form/rationale    Person(s) Educated  Patient    Methods  Explanation;Demonstration;Tactile cues;Verbal cues;Handout    Comprehension  Verbalized understanding;Returned demonstration;Verbal cues required;Tactile cues required;Need further instruction          PT Long Term Goals - 02/10/18 1010      PT LONG TERM GOAL #1   Title  Pain <=2/10 with functional walking and lifting    Baseline  7/10 at eval    Time  3    Period  Weeks    Status  New    Target Date  03/05/18      PT LONG TERM GOAL #2   Title  Pt will demo proper form with patient bed mobility     Baseline  will educate as appropriate    Time  3    Period  Weeks    Status  New    Target Date  03/05/18      PT LONG TERM GOAL #3   Title  pt will demo proper form with pushing/pulling weighted sled to mimick patient beds    Baseline  will educate as appropriate    Time  3    Period  Weeks    Status  New    Target Date  03/05/18      PT LONG TERM GOAL #4   Title  pt will demo 5/5 hip strength    Baseline  see flowsheet    Time  3    Period  Weeks    Status  New    Target Date  03/05/18      PT LONG TERM GOAL #5   Title  pt will be independent in long term stretching and exercise program for further protection of back.     Baseline  will progress and establish    Time  3    Period  Weeks    Status  New    Target Date  03/05/18             Plan - 02/10/18 1007    Clinical Impression Statement  Pt presents to PT with complaints of pain in Lt lower back into buttock that extends to Lt knee as a toothache sensation. Notable limitations in flexibility and strength in Lt hip vs Rt. 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 Will benefit from skilled PT in order to decrease pain and reach functional goals. Pt is not working at this time but I requested that he return to walking with  short distances at first and progress as toelrated.     Clinical Presentation  Stable    Clinical Decision Making  Low    Rehab Potential  Good    PT Frequency  2x / week    PT Duration  3 weeks    PT Treatment/Interventions  ADLs/Self Care Home Management;Cryotherapy;Electrical Stimulation;Ultrasound;Traction;Moist Heat;Therapeutic activities;Therapeutic exercise;Manual techniques;Patient/family education;Passive range of motion;Dry needling;Taping    PT Next Visit Plan  STM/DN PRN, core activation, review stretches, how did return to walking go?    PT Home Exercise Plan  stretches: HS, quads/hip flexors, piriformis, gastroc/soleus    Consulted and Agree with Plan of Care  Patient       Patient will benefit from skilled therapeutic intervention in order to improve the following deficits and impairments:  Difficulty walking, Increased muscle spasms, Decreased activity tolerance, Pain, Improper body mechanics, Impaired flexibility, Decreased strength  Visit Diagnosis: Acute left-sided low back pain without sciatica - Plan: PT plan of care cert/re-cert     Problem List Patient Active Problem List   Diagnosis Date Noted  . Hypertension   . Hyperlipidemia   . Diabetes mellitus without complication (HCC)   . GERD (gastroesophageal reflux disease)   . Vitamin D deficiency   . Encounter for long-term (current) use of other medications    Tino Ronan C. Kevonna Nolte PT, DPT 02/10/18 11:10 AM   [facility] [address] [place], [place], [address] Phone: [phone]   Fax:  [phone]  Name: Evander Macaraeg MRN: [id number] Date of Birth: [birth year]

## 2018-02-11 ENCOUNTER — Ambulatory Visit: Payer: PRIVATE HEALTH INSURANCE | Attending: Occupational Medicine | Admitting: Physical Therapy

## 2018-02-11 ENCOUNTER — Encounter: Payer: Self-pay | Admitting: Physical Therapy

## 2018-02-11 DIAGNOSIS — M545 Low back pain, unspecified: Secondary | ICD-10-CM

## 2018-02-11 NOTE — Therapy (Signed)
[facility] Outpatient [facility] [address] [place], [place], [address] Phone: [phone]   Fax:  [phone]  Physical Therapy Treatment  Patient Details  Name: William Moran MRN: [id number] Date of Birth: June 14, 1970 Referring Provider (PT): Lucia Gaskins, MD   Encounter Date: 02/11/2018  PT End of Session - 02/11/18 1320    Visit Number  2    Number of Visits  7    Authorization Type  [facility] 1 eval + 6 visits, Case Manager Idamae Lusher  **fax notes**    Authorization - Visit Number  1    Authorization - Number of Visits  7    PT Start Time  1145    PT Stop Time  1245    PT Time Calculation (min)  60 min    Activity Tolerance  Patient tolerated treatment well    Behavior During Therapy  [facility] for tasks assessed/performed       Past Medical History:  Diagnosis Date  . Diabetes mellitus without complication (HCC)   . Encounter for long-term (current) use of other medications   . GERD (gastroesophageal reflux disease)   . Hyperlipidemia   . Hypertension   . Vitamin D deficiency     Past Surgical History:  Procedure Laterality Date  . ANKLE SURGERY Right 2009  . OPEN REDUCTION INTERNAL FIXATION (ORIF) METACARPAL Left 05/25/2014   Procedure: OPEN REDUCTION INTERNAL FIXATION (ORIF) LEFT RING FINGER METACARPAL;  Surgeon: Cindee Salt, MD;  Location: [facility];  Service: Orthopedics;  Laterality: Left;    There were no vitals filed for this visit.  Subjective Assessment - 02/11/18 1142    Subjective  HAS DONE the exercises.  Walked 15 minutes pain 5/10 paved street flat and hill.  Pain initially  2-3/10.  Sleeping on right side( with pillow0 vs stomach    Currently in Pain?  Yes    Pain Score  3     Pain Location  Back    Pain Orientation  Left;Lower    Pain Radiating Towards  buttocks  anterior thigh proximal.     Pain Frequency  Intermittent    Aggravating Factors   laying down, being still walking/ standing longer    Pain  Relieving Factors  moving around,  exetcises, heat    Effect of Pain on Daily Activities  Not working,  limits walking, ADL limited.   Not back to the gym     Multiple Pain Sites  No                       [facility] Adult PT Treatment/Exercise - 02/11/18 0001      Self-Care   Self-Care  Other Self-Care Comments    Other Self-Care Comments   Anatomy hip flexor,  piriformis model used      Lumbar Exercises: Stretches   Passive Hamstring Stretch  3 reps;30 seconds    Passive Hamstring Stretch Limitations  cues for muscle stretch only vs nerve pain    Pelvic Tilt  5 reps    Pelvic Tilt Limitations  small motions,  mod cues needed    Piriformis Stretch  3 reps;30 seconds    Figure 4 Stretch  3 reps;30 seconds    Gastroc Stretch  3 reps;30 seconds    Gastroc Stretch Limitations  incline,  toes on      Lumbar Exercises: Supine   Clam Limitations  3 sets both and single,  cued    Heel Slides  5 reps    Heel Slides Limitations  fatigues       Knee/Hip Exercises: Supine   Bridges  10 reps    Bridges Limitations  moves slow and guarded.       Moist Heat Therapy   Number Minutes Moist Heat  10 Minutes    Moist Heat Location  Lumbar Spine      Manual Therapy   Manual Therapy  Soft tissue mobilization    Manual therapy comments  left low back, gluteal mid,  light pressure,  trigger point release     proximal gluteal, medially            PT Education - 02/11/18 1255    Education Details  anatomy,  HEP    Person(s) Educated  Patient    Methods  Explanation;Demonstration;Tactile cues;Verbal cues;Handout    Comprehension  Returned demonstration;Verbalized understanding          PT Long Term Goals - 02/10/18 1010      PT LONG TERM GOAL #1   Title  Pain <=2/10 with functional walking and lifting    Baseline  7/10 at eval    Time  3    Period  Weeks    Status  New    Target Date  03/05/18      PT LONG TERM GOAL #2   Title  Pt will demo proper form with  patient bed mobility     Baseline  will educate as appropriate    Time  3    Period  Weeks    Status  New    Target Date  03/05/18      PT LONG TERM GOAL #3   Title  pt will demo proper form with pushing/pulling weighted sled to mimick patient beds    Baseline  will educate as appropriate    Time  3    Period  Weeks    Status  New    Target Date  03/05/18      PT LONG TERM GOAL #4   Title  pt will demo 5/5 hip strength    Baseline  see flowsheet    Time  3    Period  Weeks    Status  New    Target Date  03/05/18      PT LONG TERM GOAL #5   Title  pt will be independent in long term stretching and exercise program for further protection of back.     Baseline  will progress and establish    Time  3    Period  Weeks    Status  New    Target Date  03/05/18            Plan - 02/11/18 1320    Clinical Impression Statement  Patrient was able to progress the HEP today.  Exercises hepl to decrease pain.  Pain is beginning to centralize.  patient was able to return to walking 15 minutes limited by pain.     PT Next Visit Plan  STM/DN PRN, core activation, review stretches, how did return to walking go?    PT Home Exercise Plan  stretches: HS, quads/hip flexors, piriformis, gastroc/soleus,  Abdominal bracing with Clams both and single,  heel slides, and marching.     Consulted and Agree with Plan of Care  Patient       Patient will benefit from skilled therapeutic intervention in order to improve the following deficits and impairments:  Visit Diagnosis: Acute left-sided low back pain without sciatica     Problem List Patient Active Problem List   Diagnosis Date Noted  . Hypertension   . Hyperlipidemia   . Diabetes mellitus without complication (HCC)   . GERD (gastroesophageal reflux disease)   . Vitamin D deficiency   . Encounter for long-term (current) use of other medications     Teoman Giraud PTA 02/11/2018, 1:25 PM  [facility] [address] [place], [place], [address] Phone: [phone]   Fax:  [phone]  Name: Kline Bulthuis MRN: [id number] Date of Birth: 1970-05-02

## 2018-02-11 NOTE — Patient Instructions (Addendum)
Bridging    Slowly raise buttocks from floor, keeping stomach tight. Repeat __10__ times per set. Do _1-2___ sets per session. Do _1-2___ sessions per day.  http://orth.exer.us/1096   Copyright  VHI. All rights reserved.  Pelvic Tilt    Flatten back by tightening stomach muscles and buttocks. Repeat ___10_ times per set. Do ___1_ sets per session. Do ___1-2 sessions per day.           Heel Slide to Straight    Slide one leg down to straight. Return. Be sure pelvis does not rock forward, tilt, rotate, or tip to side. Do _5__ times. Restabilize pelvis. Repeat with other leg. Do _1-2__ sets, _1-2__ times per day.  http://ss.exer.us/16   Copyright  VHI. All rights reserved.   Bent knee lift: On back with abdominals tightened.  Lift knee pause and lower without allowing hips to rock Repeat 5 to 10 X 1-2 x a day\hold 1 second.        Copyright  VHI. All rights reserved.

## 2018-02-15 ENCOUNTER — Encounter: Payer: Self-pay | Admitting: Physical Therapy

## 2018-02-15 ENCOUNTER — Ambulatory Visit: Payer: PRIVATE HEALTH INSURANCE | Attending: Internal Medicine | Admitting: Physical Therapy

## 2018-02-15 DIAGNOSIS — M545 Low back pain, unspecified: Secondary | ICD-10-CM

## 2018-02-15 NOTE — Patient Instructions (Signed)
External Rotation: Hip - Knees Apart (Hook-Lying)    Lie with hips and knees bent, band tied just above knees. Pull knees apart. Hold for _5__ seconds.  Repeat __20_ times. Do __2_ times a day. Can also try alternating legs.   Copyright  VHI. All rights reserved.

## 2018-02-15 NOTE — Therapy (Signed)
[facility] Outpatient Rehabilitation [facility] [address] [place], [place], [address] Phone: [phone]   Fax:  [phone]  Physical Therapy Treatment  Patient Details  Name: William Moran MRN: [id number] Date of Birth: 29-Apr-1970 Referring Provider (PT): Lucia Gaskins, MD   Encounter Date: 02/15/2018  PT End of Session - 02/15/18 1150    Visit Number  3    Number of Visits  7    Authorization Type  [facility] 1 eval + 6 visits, Case Manager Idamae Lusher  **fax notes**    Authorization - Visit Number  2    Authorization - Number of Visits  7    PT Start Time  1145    PT Stop Time  1235    PT Time Calculation (min)  50 min       Past Medical History:  Diagnosis Date  . Diabetes mellitus without complication (HCC)   . Encounter for long-term (current) use of other medications   . GERD (gastroesophageal reflux disease)   . Hyperlipidemia   . Hypertension   . Vitamin D deficiency     Past Surgical History:  Procedure Laterality Date  . ANKLE SURGERY Right 2009  . OPEN REDUCTION INTERNAL FIXATION (ORIF) METACARPAL Left 05/25/2014   Procedure: OPEN REDUCTION INTERNAL FIXATION (ORIF) LEFT RING FINGER METACARPAL;  Surgeon: Cindee Salt, MD;  Location: [facility];  Service: Orthopedics;  Laterality: Left;    There were no vitals filed for this visit.  Subjective Assessment - 02/15/18 1149    Subjective  Doing well. Doing the exercises. Walking 15-20 minutes. Felt good after last session.     Currently in Pain?  Yes    Pain Score  3     Pain Location  Back    Pain Orientation  Left;Lower    Pain Descriptors / Indicators  Throbbing    Pain Type  Acute pain    Pain Radiating Towards  into left buttock                       [facility] Adult PT Treatment/Exercise - 02/15/18 0001      Lumbar Exercises: Stretches   Passive Hamstring Stretch  3 reps;30 seconds    Pelvic Tilt  --   max cues, ab set only, to tilt, maintains neutral ok   Piriformis Stretch  3 reps;30 seconds    Figure 4 Stretch  3 reps;30 seconds      Lumbar Exercises: Supine   Clam Limitations  3 sets both and single,  cued   green band   Heel Slides  10 reps    Heel Slides Limitations  with abdominal draw in       Knee/Hip Exercises: Stretches   Hip Flexor Stretch Limitations  modified thomas      Knee/Hip Exercises: Supine   Bridges  10 reps      Moist Heat Therapy   Number Minutes Moist Heat  10 Minutes    Moist Heat Location  Lumbar Spine   prone     Manual Therapy   Manual Therapy  Soft tissue mobilization    Manual therapy comments  --    Soft tissue mobilization  soft tissue work with tennis ball left gluteal in sidelying , slef TPR with teninis ball supine              PT Education - 02/15/18 1232    Education Details  HEP    Person(s) Educated  Patient  Methods  Explanation;Handout    Comprehension  Verbalized understanding          PT Long Term Goals - 02/10/18 1010      PT LONG TERM GOAL #1   Title  Pain <=2/10 with functional walking and lifting    Baseline  7/10 at eval    Time  3    Period  Weeks    Status  New    Target Date  03/05/18      PT LONG TERM GOAL #2   Title  Pt will demo proper form with patient bed mobility     Baseline  will educate as appropriate    Time  3    Period  Weeks    Status  New    Target Date  03/05/18      PT LONG TERM GOAL #3   Title  pt will demo proper form with pushing/pulling weighted sled to mimick patient beds    Baseline  will educate as appropriate    Time  3    Period  Weeks    Status  New    Target Date  03/05/18      PT LONG TERM GOAL #4   Title  pt will demo 5/5 hip strength    Baseline  see flowsheet    Time  3    Period  Weeks    Status  New    Target Date  03/05/18      PT LONG TERM GOAL #5   Title  pt will be independent in long term stretching and exercise program for further protection of back.     Baseline  will progress and establish     Time  3    Period  Weeks    Status  New    Target Date  03/05/18            Plan - 02/15/18 1150    Clinical Impression Statement  Not walking as much as I used to. Walking about 15-20 minutes. used to walk 30-45 minutes. Radicular symptoms down to knee  less frequent, every 2-3 days at most. Left low back/proximal gluteal most problematic especially with sleep positions. Reviewed stretches and continued gentle core. Soft tissue work to left proximal gluteal. Given green band for clams. He is using the tennis ball for self massage.     PT Next Visit Plan  STM/DN PRN, core activation, review stretches, how did return to walking go?    PT Home Exercise Plan  stretches: HS, quads/hip flexors, piriformis, gastroc/soleus,  Abdominal bracing with Clams both and single,  heel slides, and marching.     Consulted and Agree with Plan of Care  Patient       Patient will benefit from skilled therapeutic intervention in order to improve the following deficits and impairments:  Difficulty walking, Increased muscle spasms, Decreased activity tolerance, Pain, Improper body mechanics, Impaired flexibility, Decreased strength  Visit Diagnosis: Acute left-sided low back pain without sciatica     Problem List Patient Active Problem List   Diagnosis Date Noted  . Hypertension   . Hyperlipidemia   . Diabetes mellitus without complication (HCC)   . GERD (gastroesophageal reflux disease)   . Vitamin D deficiency   . Encounter for long-term (current) use of other medications     Sherrie Mustache, PTA 02/15/2018, 12:47 PM  [facility] [address] [place], [place], [address] Phone: [phone]   Fax:  [phone]  Name: Locke Barrell MRN: [id number] Date of Birth: 02/16/1971

## 2018-02-19 DIAGNOSIS — E1165 Type 2 diabetes mellitus with hyperglycemia: Secondary | ICD-10-CM | POA: Diagnosis not present

## 2018-02-19 DIAGNOSIS — E782 Mixed hyperlipidemia: Secondary | ICD-10-CM | POA: Diagnosis not present

## 2018-02-19 DIAGNOSIS — G44209 Tension-type headache, unspecified, not intractable: Secondary | ICD-10-CM | POA: Diagnosis not present

## 2018-02-19 DIAGNOSIS — H6120 Impacted cerumen, unspecified ear: Secondary | ICD-10-CM | POA: Diagnosis not present

## 2018-02-19 DIAGNOSIS — I10 Essential (primary) hypertension: Secondary | ICD-10-CM | POA: Diagnosis not present

## 2018-02-23 ENCOUNTER — Encounter: Payer: Self-pay | Admitting: Physical Therapy

## 2018-02-23 ENCOUNTER — Ambulatory Visit: Payer: PRIVATE HEALTH INSURANCE | Attending: Occupational Medicine | Admitting: Physical Therapy

## 2018-02-23 DIAGNOSIS — M545 Low back pain, unspecified: Secondary | ICD-10-CM

## 2018-02-23 NOTE — Therapy (Signed)
[facility] Outpatient Rehabilitation [facility] [address] [place], [place], [address] Phone: [phone]   Fax:  [phone]  Physical Therapy Treatment  Patient Details  Name: William Moran MRN: [id number] Date of Birth: 1970-07-09 Referring Provider (PT): Lucia Gaskins, MD   Encounter Date: 02/23/2018  PT End of Session - 02/23/18 1021    Visit Number  4    Number of Visits  7    Authorization Type  WC 1 eval + 6 visits, Case Manager Idamae Lusher  **fax notes**    PT Start Time  1018    PT Stop Time  1058    PT Time Calculation (min)  40 min    Activity Tolerance  Patient tolerated treatment well    Behavior During Therapy  [facility] for tasks assessed/performed       Past Medical History:  Diagnosis Date  . Diabetes mellitus without complication (HCC)   . Encounter for long-term (current) use of other medications   . GERD (gastroesophageal reflux disease)   . Hyperlipidemia   . Hypertension   . Vitamin D deficiency     Past Surgical History:  Procedure Laterality Date  . ANKLE SURGERY Right 2009  . OPEN REDUCTION INTERNAL FIXATION (ORIF) METACARPAL Left 05/25/2014   Procedure: OPEN REDUCTION INTERNAL FIXATION (ORIF) LEFT RING FINGER METACARPAL;  Surgeon: Cindee Salt, MD;  Location: [facility];  Service: Orthopedics;  Laterality: Left;    There were no vitals filed for this visit.                    [facility] Adult PT Treatment/Exercise - 02/23/18 0001      Exercises   Exercises  Knee/Hip      Knee/Hip Exercises: Stretches   Passive Hamstring Stretch  Both;2 reps;30 seconds    Passive Hamstring Stretch Limitations  supine with strap    Piriformis Stretch  Both;30 seconds    Piriformis Stretch Limitations  figure 4    Gastroc Stretch  30 seconds    Gastroc Stretch Limitations  slant board      Knee/Hip Exercises: Aerobic   Nustep  3 min L4 LE only      Knee/Hip Exercises: Standing   Other Standing Knee Exercises  squat  + chop green tband    Other Standing Knee Exercises  fwd lunge with green tband press single arm      Manual Therapy   Manual therapy comments  skilled plpation and monitoring during TPDN    Soft tissue mobilization  IASTM piriformis, glut max around SIJ post DN       Trigger Point Dry Needling - 02/23/18 1039    Consent Given?  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) Educated  Patient    Methods  Explanation;Demonstration;Tactile cues;Verbal cues;Handout    Comprehension  Verbalized understanding;Returned demonstration;Verbal cues required;Tactile cues required;Need further instruction          PT Long Term Goals - 02/10/18 1010      PT LONG TERM GOAL #1   Title  Pain <=2/10 with functional walking and lifting  Baseline  7/10 at eval    Time  3    Period  Weeks    Status  New    Target Date  03/05/18      PT LONG TERM GOAL #2   Title  Pt will demo proper form with patient bed mobility     Baseline  will educate as appropriate    Time  3    Period  Weeks    Status  New    Target Date  03/05/18      PT LONG TERM GOAL #3   Title  pt will demo proper form with pushing/pulling weighted sled to mimick patient beds    Baseline  will educate as appropriate    Time  3    Period  Weeks    Status  New    Target Date  03/05/18      PT LONG TERM GOAL #4   Title  pt will demo 5/5 hip strength    Baseline  see flowsheet    Time  3    Period  Weeks    Status  New    Target Date  03/05/18      PT LONG TERM GOAL #5   Title  pt will be independent in long term stretching and exercise program for further  protection of back.     Baseline  will progress and establish    Time  3    Period  Weeks    Status  New    Target Date  03/05/18            Plan - 02/23/18 1058    Clinical Impression Statement  TPDN today reduced concordant pain and pt verbalized soreness as expected. Asked him to walk and keep moving today. Good tolerance to standing exercises with good core engagment    PT Treatment/Interventions  ADLs/Self Care Home Management;Cryotherapy;Electrical Stimulation;Ultrasound;Traction;Moist Heat;Therapeutic activities;Therapeutic exercise;Manual techniques;Patient/family education;Passive range of motion;Dry needling;Taping    PT Next Visit Plan  DN PRN, standing core, lifting    PT Home Exercise Plan  stretches: HS, quads/hip flexors, piriformis, gastroc/soleus,  Abdominal bracing with Clams both and single,  heel slides, and marching. chop, fwd lunge with press    Consulted and Agree with Plan of Care  Patient       Patient will benefit from skilled therapeutic intervention in order to improve the following deficits and impairments:  Difficulty walking, Increased muscle spasms, Decreased activity tolerance, Pain, Improper body mechanics, Impaired flexibility, Decreased strength  Visit Diagnosis: Acute left-sided low back pain without sciatica     Problem List Patient Active Problem List   Diagnosis Date Noted  . Hypertension   . Hyperlipidemia   . Diabetes mellitus without complication (HCC)   . GERD (gastroesophageal reflux disease)   . Vitamin D deficiency   . Encounter for long-term (current) use of other medications    Marja Adderley C. Rorik Vespa PT, DPT 02/23/18 11:28 AM   [facility] Outpatient Rehabilitation [facility] [address] [place], [place], [address] Phone: [phone]   Fax:  [phone]  Name: William Moran MRN: [id number] Date of Birth: December 30, 1970

## 2018-02-25 ENCOUNTER — Encounter: Payer: Self-pay | Admitting: Physical Therapy

## 2018-02-25 ENCOUNTER — Ambulatory Visit: Payer: PRIVATE HEALTH INSURANCE | Attending: Internal Medicine | Admitting: Physical Therapy

## 2018-02-25 DIAGNOSIS — M545 Low back pain, unspecified: Secondary | ICD-10-CM

## 2018-02-25 NOTE — Therapy (Signed)
[facility] Outpatient Rehabilitation [facility] [address] [place], [place], [address] Phone: [phone]   Fax:  [phone]  Physical Therapy Treatment  Patient Details  Name: William Moran MRN: [id number] Date of Birth: 1970/04/22 Referring Provider (PT): Lucia Gaskins, MD   Encounter Date: 02/25/2018  PT End of Session - 02/25/18 1223    Visit Number  5    Number of Visits  7    Authorization Type  WC 1 eval + 6 visits, Case Manager Idamae Lusher  **fax notes**    PT Start Time  1145    PT Stop Time  1223    PT Time Calculation (min)  38 min    Activity Tolerance  Patient tolerated treatment well    Behavior During Therapy  [facility] for tasks assessed/performed       Past Medical History:  Diagnosis Date  . Diabetes mellitus without complication (HCC)   . Encounter for long-term (current) use of other medications   . GERD (gastroesophageal reflux disease)   . Hyperlipidemia   . Hypertension   . Vitamin D deficiency     Past Surgical History:  Procedure Laterality Date  . ANKLE SURGERY Right 2009  . OPEN REDUCTION INTERNAL FIXATION (ORIF) METACARPAL Left 05/25/2014   Procedure: OPEN REDUCTION INTERNAL FIXATION (ORIF) LEFT RING FINGER METACARPAL;  Surgeon: Cindee Salt, MD;  Location: [facility];  Service: Orthopedics;  Laterality: Left;    There were no vitals filed for this visit.  Subjective Assessment - 02/25/18 1147    Subjective  Felt good after DN. I did 3 laps instead of 2 walking and was just tired since I had not done it in a while.     Currently in Pain?  No/denies                       [facility] Adult PT Treatment/Exercise - 02/25/18 0001      Exercises   Exercises  Knee/Hip      Lumbar Exercises: Stretches   Passive Hamstring Stretch  Right;Left;2 reps;30 seconds      Knee/Hip Exercises: Stretches   Piriformis Stretch  Both;30 seconds    Piriformis Stretch Limitations  figure 4      Knee/Hip Exercises:  Aerobic   Elliptical  5 min L1 ramp 10      Knee/Hip Exercises: Machines for Strengthening   Other Machine  FM chops, squat/cross      Knee/Hip Exercises: Supine   Other Supine Knee/Hip Exercises  TT holds    Other Supine Knee/Hip Exercises  dead bug, obliqu crunch with single leg TT      Knee/Hip Exercises: Sidelying   Other Sidelying Knee/Hip Exercises  side planks                  PT Long Term Goals - 02/10/18 1010      PT LONG TERM GOAL #1   Title  Pain <=2/10 with functional walking and lifting    Baseline  7/10 at eval    Time  3    Period  Weeks    Status  New    Target Date  03/05/18      PT LONG TERM GOAL #2   Title  Pt will demo proper form with patient bed mobility     Baseline  will educate as appropriate    Time  3    Period  Weeks    Status  New    Target  Date  03/05/18      PT LONG TERM GOAL #3   Title  pt will demo proper form with pushing/pulling weighted sled to mimick patient beds    Baseline  will educate as appropriate    Time  3    Period  Weeks    Status  New    Target Date  03/05/18      PT LONG TERM GOAL #4   Title  pt will demo 5/5 hip strength    Baseline  see flowsheet    Time  3    Period  Weeks    Status  New    Target Date  03/05/18      PT LONG TERM GOAL #5   Title  pt will be independent in long term stretching and exercise program for further protection of back.     Baseline  will progress and establish    Time  3    Period  Weeks    Status  New    Target Date  03/05/18            Plan - 02/25/18 1224    Clinical Impression Statement  Adde higher level core strengthening exercises and increase challenge in lifting. Did begin to feel his lower back with lifting activities.     PT Treatment/Interventions  ADLs/Self Care Home Management;Cryotherapy;Electrical Stimulation;Ultrasound;Traction;Moist Heat;Therapeutic activities;Therapeutic exercise;Manual techniques;Patient/family education;Passive range of  motion;Dry needling;Taping    PT Next Visit Plan  continue standing lifting challenges, progress planks    PT Home Exercise Plan  stretches: HS, quads/hip flexors, piriformis, gastroc/soleus,  Abdominal bracing with Clams both and single,  heel slides, and marching. chop, fwd lunge with press, side plank, dead bug, crunch    Consulted and Agree with Plan of Care  Patient       Patient will benefit from skilled therapeutic intervention in order to improve the following deficits and impairments:  Difficulty walking, Increased muscle spasms, Decreased activity tolerance, Pain, Improper body mechanics, Impaired flexibility, Decreased strength  Visit Diagnosis: Acute left-sided low back pain without sciatica     Problem List Patient Active Problem List   Diagnosis Date Noted  . Hypertension   . Hyperlipidemia   . Diabetes mellitus without complication (HCC)   . GERD (gastroesophageal reflux disease)   . Vitamin D deficiency   . Encounter for long-term (current) use of other medications     Yazleemar Strassner C. Ervie Mccard PT, DPT 02/25/18 12:30 PM   [facility] Outpatient Rehabilitation [facility] [address] [place], [place], [address] Phone: [phone]   Fax:  [phone]  Name: William Moran MRN: [id number] Date of Birth: July 11, 1970

## 2018-03-02 ENCOUNTER — Encounter: Payer: Self-pay | Admitting: Physical Therapy

## 2018-03-02 ENCOUNTER — Ambulatory Visit: Payer: PRIVATE HEALTH INSURANCE | Attending: Occupational Medicine | Admitting: Physical Therapy

## 2018-03-02 DIAGNOSIS — M545 Low back pain, unspecified: Secondary | ICD-10-CM

## 2018-03-02 NOTE — Therapy (Signed)
[facility] Outpatient Rehabilitation [facility] [address] [place], [place], [address] Phone: [phone]   Fax:  [phone]  Physical Therapy Treatment  Patient Details  Name: William Moran MRN: [id number] Date of Birth: Jul 14, 1970 Referring Provider (PT): Lucia Gaskins, MD   Encounter Date: 03/02/2018  PT End of Session - 03/02/18 0936    Visit Number  6    Number of Visits  7    Authorization Type  WC 1 eval + 6 visits, Case Manager Idamae Lusher  **fax notes**    PT Start Time  [phone]    PT Stop Time  1010    PT Time Calculation (min)  35 min    Activity Tolerance  Patient tolerated treatment well    Behavior During Therapy  [facility] for tasks assessed/performed       Past Medical History:  Diagnosis Date  . Diabetes mellitus without complication (HCC)   . Encounter for long-term (current) use of other medications   . GERD (gastroesophageal reflux disease)   . Hyperlipidemia   . Hypertension   . Vitamin D deficiency     Past Surgical History:  Procedure Laterality Date  . ANKLE SURGERY Right 2009  . OPEN REDUCTION INTERNAL FIXATION (ORIF) METACARPAL Left 05/25/2014   Procedure: OPEN REDUCTION INTERNAL FIXATION (ORIF) LEFT RING FINGER METACARPAL;  Surgeon: Cindee Salt, MD;  Location: [facility];  Service: Orthopedics;  Laterality: Left;    There were no vitals filed for this visit.  Subjective Assessment - 03/02/18 0937    Subjective  Began feeling pain in left thigh- I may have done to much exercise.     Currently in Pain?  No/denies                       [facility] Adult PT Treatment/Exercise - 03/02/18 0001      Exercises   Exercises  Knee/Hip      Knee/Hip Exercises: Stretches   Passive Hamstring Stretch  Both;2 reps;30 seconds    Passive Hamstring Stretch Limitations  supine with strap    Hip Flexor Stretch Limitations  modified thomas x30s each x2 in session    Piriformis Stretch Limitations  figure 4      Knee/Hip  Exercises: Aerobic   Stationary Bike  5 min L3      Knee/Hip Exercises: Sidelying   Hip ABduction Limitations  SL hip abd+ext 2lb    Clams  x20 each red tband      Knee/Hip Exercises: Prone   Other Prone Exercises  primal push ups    Other Prone Exercises  plank elbows/toes                  PT Long Term Goals - 02/10/18 1010      PT LONG TERM GOAL #1   Title  Pain <=2/10 with functional walking and lifting    Baseline  7/10 at eval    Time  3    Period  Weeks    Status  New    Target Date  03/05/18      PT LONG TERM GOAL #2   Title  Pt will demo proper form with patient bed mobility     Baseline  will educate as appropriate    Time  3    Period  Weeks    Status  New    Target Date  03/05/18      PT LONG TERM GOAL #3   Title  pt will demo proper form with pushing/pulling weighted sled to mimick patient beds    Baseline  will educate as appropriate    Time  3    Period  Weeks    Status  New    Target Date  03/05/18      PT LONG TERM GOAL #4   Title  pt will demo 5/5 hip strength    Baseline  see flowsheet    Time  3    Period  Weeks    Status  New    Target Date  03/05/18      PT LONG TERM GOAL #5   Title  pt will be independent in long term stretching and exercise program for further protection of back.     Baseline  will progress and establish    Time  3    Period  Weeks    Status  New    Target Date  03/05/18            Plan - 03/02/18 0942    Clinical Impression Statement  soreness in left thigh today after exercises and discussed importance of stretching after exercises. Added planks to HEP which were difficult and required cues. is on track to d/c at next visit    PT Treatment/Interventions  ADLs/Self Care Home Management;Cryotherapy;Electrical Stimulation;Ultrasound;Traction;Moist Heat;Therapeutic activities;Therapeutic exercise;Manual techniques;Patient/family education;Passive range of motion;Dry needling;Taping    PT Next Visit Plan   d/c    PT Home Exercise Plan  stretches: HS, quads/hip flexors, piriformis, gastroc/soleus,  Abdominal bracing with Clams both and single,  heel slides, and marching. chop, fwd lunge with press, side plank, dead bug, crunch    Consulted and Agree with Plan of Care  Patient       Patient will benefit from skilled therapeutic intervention in order to improve the following deficits and impairments:  Difficulty walking, Increased muscle spasms, Decreased activity tolerance, Pain, Improper body mechanics, Impaired flexibility, Decreased strength  Visit Diagnosis: Acute left-sided low back pain without sciatica     Problem List Patient Active Problem List   Diagnosis Date Noted  . Hypertension   . Hyperlipidemia   . Diabetes mellitus without complication (HCC)   . GERD (gastroesophageal reflux disease)   . Vitamin D deficiency   . Encounter for long-term (current) use of other medications     Ardon [facility] C. Rilynne Lonsway PT, DPT 03/02/18 10:13 AM   [facility] Health Outpatient Rehabilitation [facility] [address] [place], [place], [address] Phone: [phone]   Fax:  [phone]  Name: William Moran MRN: [id number] Date of Birth: 07-29-1970

## 2018-03-04 ENCOUNTER — Ambulatory Visit: Payer: PRIVATE HEALTH INSURANCE | Admitting: Physical Therapy

## 2018-03-04 ENCOUNTER — Encounter: Payer: Self-pay | Admitting: Physical Therapy

## 2018-03-04 DIAGNOSIS — M545 Low back pain, unspecified: Secondary | ICD-10-CM

## 2018-03-04 NOTE — Therapy (Signed)
William Moran, [place], [address] Phone: [phone]   Fax:  [phone]  Physical Therapy Treatment/Discharge  Patient Details  Name: William Moran MRN: [id number] Date of Birth: 10-Sep-1970 Referring Provider (PT): William Monarch, MD   Encounter Date: 03/04/2018  PT End of Session - 03/04/18 1021    Visit Number  7    Number of Visits  7    Authorization Type  WC 1 eval + 6 visits, Case Manager William Moran  **fax notes**    PT Start Time  1020    PT Stop Time  1039    PT Time Calculation (min)  19 min    Activity Tolerance  Patient tolerated treatment well    Behavior During Therapy  [facility] for tasks assessed/performed       Past Medical History:  Diagnosis Date  . Diabetes mellitus without complication ([place])   . Encounter for long-term (current) use of other medications   . GERD (gastroesophageal reflux disease)   . Hyperlipidemia   . Hypertension   . Vitamin D deficiency     Past Surgical History:  Procedure Laterality Date  . ANKLE SURGERY Right 2009  . OPEN REDUCTION INTERNAL FIXATION (ORIF) METACARPAL Left 05/25/2014   Procedure: OPEN REDUCTION INTERNAL FIXATION (ORIF) LEFT RING FINGER METACARPAL;  Surgeon: William Brod, MD;  Location: [facility];  Service: Orthopedics;  Laterality: Left;    There were no vitals filed for this visit.  Subjective Assessment - 03/04/18 1022    Subjective  Still a little sore but feeling fine. They told me I can go back to work Monday    Currently in Pain?  No/denies         [facility] PT Assessment - 03/04/18 0001      Assessment   Medical Diagnosis  LBP    Referring Provider (PT)  William Monarch, MD    Onset Date/Surgical Date  01/19/18      Observation/Other Assessments   Focus on Therapeutic Outcomes (FOTO)   6% limited      Strength   Right Hip Flexion  5/5    Right Hip ABduction  5/5    Left Hip Flexion  4/5    Left Hip ABduction  4-/5      Palpation   Palpation comment  soreness in left quads                           PT Education - 03/04/18 1043    Education Details  goals discussion, pt mobility, pushing/pulling, importance of continued HEP    Person(s) Educated  Patient    Methods  Explanation    Comprehension  Verbalized understanding          PT Long Term Goals - 03/04/18 1023      PT LONG TERM GOAL #1   Title  Pain <=2/10 with functional walking and lifting    Baseline  nothing major, just something every now and then    Status  Achieved      PT LONG TERM GOAL #2   Title  Pt will demo proper form with patient bed mobility     Status  Achieved      PT LONG TERM GOAL #3   Title  pt will demo proper form with pushing/pulling weighted sled to mimick patient beds    Status  Achieved      PT LONG TERM GOAL #4  Title  pt will demo 5/5 hip strength    Baseline  gross 5/5    Status  Achieved      PT LONG TERM GOAL #5   Title  pt will be independent in long term stretching and exercise program for further protection of back.     Status  Achieved            Plan - 03/04/18 1044    Clinical Impression Statement  Pt has met all of his goals at this time and is prepared for D/C. He reports being comfortable with HEP and will be able to carry on techniques in work activities for safety. Encouraged pt to contact us with any further questions.     PT Treatment/Interventions  ADLs/Self Care Home Management;Cryotherapy;Electrical Stimulation;Ultrasound;Traction;Moist Heat;Therapeutic activities;Therapeutic exercise;Manual techniques;Patient/family education;Passive range of motion;Dry needling;Taping    PT Home Exercise Plan  stretches: HS, quads/hip flexors, piriformis, gastroc/soleus,  Abdominal bracing with Clams both and single,  heel slides, and marching. chop, fwd lunge with press, side plank, dead bug, crunch    Consulted and Agree with Plan of Care  Patient       Patient will  benefit from skilled therapeutic intervention in order to improve the following deficits and impairments:  Difficulty walking, Increased muscle spasms, Decreased activity tolerance, Pain, Improper body mechanics, Impaired flexibility, Decreased strength  Visit Diagnosis: Acute left-sided low back pain without sciatica     Problem List Patient Active Problem List   Diagnosis Date Noted  . Hypertension   . Hyperlipidemia   . Diabetes mellitus without complication ([place])   . GERD (gastroesophageal reflux disease)   . Vitamin D deficiency   . Encounter for long-term (current) use of other medications    PHYSICAL THERAPY DISCHARGE SUMMARY  Visits from Start of Care: 7  Current functional level related to goals / functional outcomes: See above   Remaining deficits: See above   Education / Equipment: Anatomy of condition, poc, hep, exercise form/rationale  Plan: Patient agrees to discharge.  Patient goals were met. Patient is being discharged due to meeting the stated rehab goals.  ?????     William Moran PT, DPT 03/04/18 10:49 AM   [facility] [address] [place], [place], [address] Phone: [phone]   Fax:  [phone]  Name: William Moran MRN: [id number] Date of Birth: [birth year]

## 2018-05-11 DIAGNOSIS — E1165 Type 2 diabetes mellitus with hyperglycemia: Secondary | ICD-10-CM | POA: Diagnosis not present

## 2018-05-11 DIAGNOSIS — E782 Mixed hyperlipidemia: Secondary | ICD-10-CM | POA: Diagnosis not present

## 2018-05-28 DIAGNOSIS — G44209 Tension-type headache, unspecified, not intractable: Secondary | ICD-10-CM | POA: Diagnosis not present

## 2018-05-28 DIAGNOSIS — I10 Essential (primary) hypertension: Secondary | ICD-10-CM | POA: Diagnosis not present

## 2018-05-28 DIAGNOSIS — E1165 Type 2 diabetes mellitus with hyperglycemia: Secondary | ICD-10-CM | POA: Diagnosis not present

## 2018-05-28 DIAGNOSIS — E782 Mixed hyperlipidemia: Secondary | ICD-10-CM | POA: Diagnosis not present

## 2018-06-17 MED FILL — METHYLPREDNISOLONE 4 MG TAB: 4 | 6 days supply | Qty: 21 | Fill #0

## 2018-06-29 ENCOUNTER — Other Ambulatory Visit: Payer: Self-pay | Admitting: Neurosurgery

## 2018-06-29 ENCOUNTER — Other Ambulatory Visit (HOSPITAL_COMMUNITY): Payer: Self-pay | Admitting: Neurosurgery

## 2018-06-29 DIAGNOSIS — M5416 Radiculopathy, lumbar region: Secondary | ICD-10-CM

## 2018-07-05 ENCOUNTER — Other Ambulatory Visit: Payer: Self-pay

## 2018-07-05 ENCOUNTER — Ambulatory Visit (HOSPITAL_COMMUNITY)
Admission: RE | Admit: 2018-07-05 | Discharge: 2018-07-05 | Disposition: A | Discharge status: Home / Self Care | Payer: PRIVATE HEALTH INSURANCE | Source: Ambulatory Visit | Attending: Neurosurgery | Admitting: Neurosurgery

## 2018-07-05 DIAGNOSIS — M5416 Radiculopathy, lumbar region: Secondary | ICD-10-CM | POA: Insufficient documentation

## 2018-07-27 ENCOUNTER — Other Ambulatory Visit: Payer: Self-pay

## 2018-07-27 ENCOUNTER — Ambulatory Visit: Payer: 59

## 2018-07-27 ENCOUNTER — Encounter: Payer: Self-pay | Admitting: Sports Medicine

## 2018-07-27 ENCOUNTER — Ambulatory Visit (INDEPENDENT_AMBULATORY_CARE_PROVIDER_SITE_OTHER): Payer: 59

## 2018-07-27 ENCOUNTER — Ambulatory Visit: Payer: 59 | Admitting: Sports Medicine

## 2018-07-27 ENCOUNTER — Other Ambulatory Visit: Payer: Self-pay | Admitting: Sports Medicine

## 2018-07-27 VITALS — BP 139/92 | HR 103 | Temp 97.9°F | Resp 16

## 2018-07-27 DIAGNOSIS — M7752 Other enthesopathy of left foot: Secondary | ICD-10-CM | POA: Diagnosis not present

## 2018-07-27 DIAGNOSIS — M25572 Pain in left ankle and joints of left foot: Secondary | ICD-10-CM

## 2018-07-27 DIAGNOSIS — M79672 Pain in left foot: Principal | ICD-10-CM

## 2018-07-27 DIAGNOSIS — M779 Enthesopathy, unspecified: Secondary | ICD-10-CM

## 2018-07-27 DIAGNOSIS — B351 Tinea unguium: Secondary | ICD-10-CM | POA: Diagnosis not present

## 2018-07-27 DIAGNOSIS — M79674 Pain in right toe(s): Secondary | ICD-10-CM | POA: Diagnosis not present

## 2018-07-27 DIAGNOSIS — M79675 Pain in left toe(s): Secondary | ICD-10-CM | POA: Diagnosis not present

## 2018-07-27 DIAGNOSIS — E119 Type 2 diabetes mellitus without complications: Secondary | ICD-10-CM

## 2018-07-27 DIAGNOSIS — M79671 Pain in right foot: Secondary | ICD-10-CM | POA: Diagnosis not present

## 2018-07-27 DIAGNOSIS — M722 Plantar fascial fibromatosis: Secondary | ICD-10-CM

## 2018-07-27 NOTE — Patient Instructions (Signed)
Plantar Fasciitis Rehab Ask your health care provider which exercises are safe for you. Do exercises exactly as told by your health care provider and adjust them as directed. It is normal to feel mild stretching, pulling, tightness, or discomfort as you do these exercises, but you should stop right away if you feel sudden pain or your pain gets worse. Do not begin these exercises until told by your health care provider. Stretching and range of motion exercises These exercises warm up your muscles and joints and improve the movement and flexibility of your foot. These exercises also help to relieve pain. Exercise A: Plantar fascia stretch  1. Sit with your left / right leg crossed over your opposite knee. 2. Hold your heel with one hand with that thumb near your arch. With your other hand, hold your toes and gently pull them back toward the top of your foot. You should feel a stretch on the bottom of your toes or your foot or both. 3. Hold this stretch for__________ seconds. 4. Slowly release your toes and return to the starting position. Repeat __________ times. Complete this exercise __________ times a day. Exercise B: Gastroc, standing  1. Stand with your hands against a wall. 2. Extend your left / right leg behind you, and bend your front knee slightly. 3. Keeping your heels on the floor and keeping your back knee straight, shift your weight toward the wall without arching your back. You should feel a gentle stretch in your left / right calf. 4. Hold this position for __________ seconds. Repeat __________ times. Complete this exercise __________ times a day. Exercise C: Soleus, standing 1. Stand with your hands against a wall. 2. Extend your left / right leg behind you, and bend your front knee slightly. 3. Keeping your heels on the floor, bend your back knee and slightly shift your weight over the back leg. You should feel a gentle stretch deep in your calf. 4. Hold this position for  __________ seconds. Repeat __________ times. Complete this exercise __________ times a day. Exercise D: Gastrocsoleus, standing 1. Stand with the ball of your left / right foot on a step. The ball of your foot is on the walking surface, right under your toes. 2. Keep your other foot firmly on the same step. 3. Hold onto the wall or a railing for balance. 4. Slowly lift your other foot, allowing your body weight to press your heel down over the edge of the step. You should feel a stretch in your left / right calf. 5. Hold this position for __________ seconds. 6. Return both feet to the step. 7. Repeat this exercise with a slight bend in your left / right knee. Repeat __________ times with your left / right knee straight and __________ times with your left / right knee bent. Complete this exercise __________ times a day. Balance exercise This exercise builds your balance and strength control of your arch to help take pressure off your plantar fascia. Exercise E: Single leg stand 1. Without shoes, stand near a railing or in a doorway. You may hold onto the railing or door frame as needed. 2. Stand on your left / right foot. Keep your big toe down on the floor and try to keep your arch lifted. Do not let your foot roll inward. 3. Hold this position for __________ seconds. 4. If this exercise is too easy, you can try it with your eyes closed or while standing on a pillow. Repeat __________ times. Complete  this exercise __________ times a day. This information is not intended to replace advice given to you by your health care provider. Make sure you discuss any questions you have with your health care provider. Document Released: 04/07/2005 Document Revised: 12/11/2015 Document Reviewed: 02/19/2015 Elsevier Interactive Patient Education  2019 [employer]   Posterior Tibialis Tendinosis or itis Posterior tibialis tendinosis is irritation and degeneration of a tendon called the posterior tibial  tendon. Your posterior tibial tendon is a cord-like tissue that connects bones of your lower leg and foot to a muscle that:  Supports your arch.  Helps you raise up on your toes.  Helps you turn your foot down and in. This condition causes foot and ankle pain and can lead to a flat foot. What are the causes? This condition is most often caused by repeated stress to the tendon (overuse injury). It can also be caused by a sudden injury that stresses the tendon, such as landing on your foot after jumping or falling. What increases the risk? This condition is more likely to develop in:  People who play a sport that involves putting a lot of pressure on the feet, such as: ? Basketball. ? Tennis. ? Soccer. ? Hockey.  Runners.  Females who are older than 40 years and are overweight.  People with diabetes.  People with decreased foot stability (ligamentous laxity).  People with flat feet. What are the signs or symptoms? Symptoms of this condition may start suddenly or gradually. Symptoms include:  Pain in the inner ankle.  Pain at the arch of your foot.  Pain that gets worse with running, walking, or standing.  Swelling on the inside of your ankle and foot.  Weakness in your ankle or foot.  Inability to stand up on tiptoe. How is this diagnosed? This condition may be diagnosed based on:  Your symptoms.  Your medical history.  A physical exam.  Tests, such as: ? An X-ray. ? MRI. ? An ultrasound. During the physical exam, your health care provider may move your foot and ankle, test your strength and balance, and check the arch of your foot while you stand or walk. How is this treated? This condition may be treated by:  Replacing high-impact exercise with low-impact exercise, such as swimming or cycling.  Applying ice to the injured area.  Taking an anti-inflammatory pain medicine.  Physical therapy.  Wearing a special shoe or shoe insert to support your arch  (orthotic). If your symptoms do not improve with these treatments, you may need to wear a splint, removable walking boot, or short leg cast for 6-8 weeks to keep your foot and ankle still. Follow these instructions at home: If you have a boot or splint:  Wear the boot or splint as told by your health care provider. Remove it only as told by your health care provider.  Do not use your foot to support (bear) your full body weight until your health care provider says that you can.  Loosen the boot or splint if your toes tingle, become numb, or turn cold and blue.  Keep the boot or splint clean.  If your boot or splint is not waterproof: ? Do not let it get wet. ? Cover it with a watertight plastic bag when you take a bath or shower. If you have a cast:  Do not stick anything inside the cast to scratch your skin. Doing that increases your risk of infection.  Check the skin around the cast every day.  Tell your health care provider about any concerns.  You may put lotion on dry skin around the edges of the cast. Do not apply lotion to the skin underneath the cast.  Keep the cast clean.  Do not take baths, swim, or use a hot tub until your health care provider approves. Ask your health care provider if you can take showers. You may only be allowed to take sponge baths for bathing.  If your cast is not waterproof: ? Do not let it get wet. ? Cover it with a watertight plastic bag while you take a bath or a shower. Managing pain and swelling  Take over-the-counter and prescription medicines only as told by your health care provider.  If directed, apply ice to the injured area: ? Put ice in a plastic bag. ? Place a towel between your skin and the bag. ? Leave the ice on for 20 minutes, 2-3 times a day.  Raise (elevate) your ankle above the level of your heart when resting if you have swelling. Activity  Do not do activities that make pain or swelling worse.  Return to full activity  gradually as symptoms improve.  Do exercises as told by your health care provider. General instructions  If you have an orthotic, use it as told by your health care provider.  Keep all follow-up visits as told by your health care provider. This is important. How is this prevented?  Wear footwear that is appropriate to your athletic activity.  Avoid athletic activities that cause pain or swelling in your ankle or foot.  Before being active, do range-of-motion and stretching exercises.  If you develop pain or swelling while training, stop training.  If you have pain or swelling that does not improve after a few days of rest, see your health care provider.  If you start a new athletic activity, start gradually so you can build up your strength and flexibility. Contact a health care provider if:  Your symptoms get worse.  Your symptoms do not improve in 6-8 weeks.  You develop new, unexplained symptoms.  Your splint, boot, or cast gets damaged. This information is not intended to replace advice given to you by your health care provider. Make sure you discuss any questions you have with your health care provider. Document Released: 04/07/2005 Document Revised: 12/11/2015 Document Reviewed: 12/22/2014 Elsevier Interactive Patient Education  2019 [employer].

## 2018-07-27 NOTE — Progress Notes (Signed)
Subjective: William Moran is a 48 y.o. male patient with history of diabetes who presents to office today complaining of long,mildly painful nails  while ambulating in shoes; unable to trim and also reports pain to both feet along the heels and arches and reports that last Thursday got a steroid shot in his back and reports that his left foot hurts and throbs in his ankle worse some nights but not every night for the last 6 months. Patient states that the glucose reading this morning was 175mg /dl. Patient denies any new changes in medication or new problems. Patient denies any new cramping, numbness, burning or tingling in the legs.  Review of Systems  Musculoskeletal: Positive for joint pain.  All other systems reviewed and are negative.    Patient Active Problem List   Diagnosis Date Noted  . Hypertension   . Hyperlipidemia   . Diabetes mellitus without complication (HCC)   . GERD (gastroesophageal reflux disease)   . Vitamin D deficiency   . Encounter for long-term (current) use of other medications    Current Outpatient Medications on File Prior to Visit  Medication Sig Dispense Refill  . metFORMIN (GLUCOPHAGE) 500 MG tablet Take 500 mg by mouth 4 (four) times daily. Pt takes 2 in the morning and 2 in the evening     No current facility-administered medications on file prior to visit.    No Known Allergies  No results found for this or any previous visit (from the past 2160 hour(s)).  Objective: General: Patient is awake, alert, and oriented x 3 and in no acute distress.  Integument: Skin is warm, dry and supple bilateral. Nails are tender, long, thickened and  dystrophic with subungual debris, consistent with onychomycosis, 1-5 bilateral. No signs of infection. No open lesions or preulcerative lesions present bilateral. Remaining integument unremarkable.  Vasculature:  Dorsalis Pedis pulse 2/4 bilateral. Posterior Tibial pulse  2/4 bilateral.  Capillary fill time <3 sec  1-5 bilateral. Positive hair growth to the level of the digits. Temperature gradient within normal limits. No varicosities present bilateral. No edema present bilateral.   Neurology: The patient has intact sensation measured with a 5.07/10g Semmes Weinstein Monofilament at all pedal sites bilateral . Vibratory sensation diminished bilateral with tuning fork. No Babinski sign present bilateral.   Musculoskeletal: +Pes planus and pain to medial ankle on left at PT tendon and at plantar fascial insertions bilateral. Muscular strength 5/5 in all lower extremity muscular groups bilateral without pain on range of motion . No tenderness with calf compression bilateral.  Assessment and Plan: Problem List Items Addressed This Visit      Endocrine   Diabetes mellitus without complication (HCC)    Other Visit Diagnoses    Acute left ankle pain    -  Primary   Relevant Orders   DG Ankle Complete Left   Right foot pain       Relevant Orders   DG Foot Complete Right   Tendonitis       Plantar fasciitis       Pain due to onychomycosis of toenails of both feet          -Examined patient. -Discussed and educated patient on diabetic foot care, especially with  regards to the vascular, neurological and musculoskeletal systems.  -Stressed the importance of good glycemic control and the detriment of not  controlling glucose levels in relation to the foot. -Mechanically debrided all nails 1-5 bilateral using sterile nail nipper and filed with dremel without incident  -  Patient declined steroid injection on left -Rx [place] for left -Recommend icing and gentle stretching bilateral daily -Recommend good supportive shoes and get rid of old bedroom shoes -Answered all patient questions -Patient to return  in 3 months for at risk foot care or sooner if ankle pain does not improve -Patient advised to call the office if any problems or questions arise in the meantime.  Asencion Islamitorya Dhruv Christina, DPM

## 2018-08-18 MED FILL — metFORMIN HCL ER 500 MG TB2: 500 | 90 days supply | Qty: 360 | Fill #0

## 2018-08-26 ENCOUNTER — Other Ambulatory Visit (HOSPITAL_COMMUNITY): Payer: Self-pay | Admitting: Neurosurgery

## 2018-08-26 DIAGNOSIS — M21372 Foot drop, left foot: Secondary | ICD-10-CM

## 2018-08-27 ENCOUNTER — Other Ambulatory Visit: Payer: Self-pay

## 2018-08-27 ENCOUNTER — Ambulatory Visit (HOSPITAL_COMMUNITY)
Admission: RE | Admit: 2018-08-27 | Discharge: 2018-08-27 | Disposition: A | Discharge status: Home / Self Care | Payer: PRIVATE HEALTH INSURANCE | Source: Ambulatory Visit | Attending: Neurosurgery | Admitting: Neurosurgery

## 2018-08-27 DIAGNOSIS — M21372 Foot drop, left foot: Secondary | ICD-10-CM | POA: Diagnosis present

## 2018-08-27 HISTORY — PX: OTHER SURGICAL HISTORY: SHX169

## 2018-10-05 ENCOUNTER — Ambulatory Visit: Payer: PRIVATE HEALTH INSURANCE | Attending: Neurosurgery | Admitting: Physical Therapy

## 2018-10-05 ENCOUNTER — Encounter: Payer: Self-pay | Admitting: Physical Therapy

## 2018-10-05 ENCOUNTER — Other Ambulatory Visit: Payer: Self-pay

## 2018-10-05 DIAGNOSIS — R2689 Other abnormalities of gait and mobility: Secondary | ICD-10-CM

## 2018-10-05 DIAGNOSIS — M21372 Foot drop, left foot: Secondary | ICD-10-CM | POA: Diagnosis not present

## 2018-10-05 DIAGNOSIS — M6281 Muscle weakness (generalized): Secondary | ICD-10-CM | POA: Diagnosis present

## 2018-10-05 NOTE — Therapy (Signed)
[place], [place], [address] Phone: [phone]   Fax:  [phone]  Physical Therapy Evaluation  Patient Details  Name: William Moran MRN: [id number] Date of Birth: [birth year] Referring Provider (PT): Erline Levine, MD   Encounter Date: 10/05/2018  PT End of Session - 10/05/18 1705    Visit Number  1    Number of Visits  25    Date for PT Re-Evaluation  12/31/18    Authorization Type  Workers Comp    Authorization - Visit Number  1    Authorization - Number of Visits  10    PT Start Time  1700    PT Stop Time  1745    PT Time Calculation (min)  45 min    Activity Tolerance  Patient tolerated treatment well    Behavior During Therapy  [facility] for tasks assessed/performed       Past Medical History:  Diagnosis Date  . Diabetes mellitus without complication ([place])   . Encounter for long-term (current) use of other medications   . GERD (gastroesophageal reflux disease)   . Hyperlipidemia   . Hypertension   . Vitamin D deficiency     Past Surgical History:  Procedure Laterality Date  . ANKLE SURGERY Right 2009  . OPEN REDUCTION INTERNAL FIXATION (ORIF) METACARPAL Left 05/25/2014   Procedure: OPEN REDUCTION INTERNAL FIXATION (ORIF) LEFT RING FINGER METACARPAL;  Surgeon: Daryll Brod, MD;  Location: [facility];  Service: Orthopedics;  Laterality: Left;    There were no vitals filed for this visit.   Subjective Assessment - 10/05/18 1706    Subjective  I went out again with my back, developed drop foot May 1 in the morning. MD wanted to do surgery immediately and did it on May 8- laminectomy. Back feels fine, but my foot feels tightness and numbness. Left hip and knee are sore. No pain, just discomfort in the foot. A lot of tightness and numbness.Sleeps in a dorsiflexion brace.    How long can you walk comfortably?  1 hour    Currently in Pain?  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      Cognition   Overall Cognitive Status  Within Functional Limits for tasks assessed      Observation/Other Assessments   Focus on Therapeutic Outcomes (FOTO)   54% limited      Sensation   Additional Comments  numbness in foot comes/goes, majority at night      ROM / Strength   AROM / PROM / Strength  AROM;PROM;Strength      AROM   AROM Assessment Site  Ankle  Right/Left Ankle  Left    Left Ankle Dorsiflexion  --   no active motion available     PROM   PROM Assessment Site  Ankle    Right/Left Ankle  Left    Left Ankle Dorsiflexion  -8      Strength   Strength Assessment Site  Hip;Knee;Ankle    Right/Left Hip  Left    Left Hip Flexion  4-/5    Left Hip Extension  3+/5    Left Hip ABduction  3-/5    Right/Left Knee  Left    Left Knee Flexion  3+/5    Left Knee Extension  5/5    Right/Left Ankle  Left    Left Ankle Dorsiflexion  0/5    Left Ankle Plantar Flexion  3/5    Left Ankle Inversion  1/5    Left Ankle Eversion  1/5      Ambulation/Gait   Gait Comments  Lt trendelenburg with Lt trunk lean, Lt knee varus, lacking DF, lacking hip extension or toe off                Objective measurements completed on examination: See above findings.      [facility] - 10/05/18 0001      Exercises   Exercises  Knee/Hip;Ankle      Knee/Hip  Exercises: Stretches   Piriformis Stretch Limitations  figure 4 ER      Knee/Hip Exercises: Standing   Other Standing Knee Exercises  weight shift with activaiton of hip abductors      Knee/Hip Exercises: Sidelying   Clams  cues for rolling forward- avoid use of low back      Knee/Hip Exercises: Prone   Hamstring Curl Limitations  cues to keep hips flat on table- avoid low back use      Ankle Exercises: Stretches   Gastroc Stretch Limitations  long sitting with towel      Ankle Exercises: Seated   Other Seated Ankle Exercises  attempted AROM    Other Seated Ankle Exercises  toe curls/toe yoga             PT Education - 10/05/18 1705    Education Details  anatomy of condition, POC, HEP, exercise form/rationale, orthotics    Person(s) Educated  Patient    Methods  Explanation;Demonstration;Tactile cues;Verbal cues;Handout    Comprehension  Verbalized understanding;Returned demonstration;Verbal cues required;Tactile cues required;Need further instruction       PT Short Term Goals - 10/05/18 1758      PT SHORT TERM GOAL #1   Title  Pt will demo hip & knee strength gross 4/5    Baseline  see flowsheet    Time  6    Period  Weeks    Status  New    Target Date  11/19/18      PT SHORT TERM GOAL #2   Title  DF PROM +5    Baseline  -8 at eval    Time  6    Period  Weeks    Status  New    Target Date  11/19/18      PT SHORT TERM GOAL #3   Title  Pt will demo SLS with bilat UE support with level pelvis    Baseline  unable at eval    Time  6    Period  Weeks    Status  New    Target Date  11/19/18  PT Long Term Goals - 10/05/18 1801      PT LONG TERM GOAL #1   Title  Pt will ambulate without trendelenburg gait    Baseline  significant at eval- see flowsheet    Time  12    Period  Weeks    Status  New    Target Date  12/31/18      PT LONG TERM GOAL #2   Title  Gross hip/knee strength to 5/5    Baseline  see flowsheet    Time  12    Period  Weeks     Status  New    Target Date  12/31/18      PT LONG TERM GOAL #3   Title  Pt will demo ability to squat and return to stand while lifting objects    Baseline  lacking balance and strength necessary at eval    Time  12    Period  Weeks    Status  New    Target Date  12/31/18      PT LONG TERM GOAL #4   Title  pt will be able to push/pull weighted sled for return to work duties    Baseline  lacking necessary strength/ROM/Balance at eval    Time  12    Period  Weeks    Status  New    Target Date  12/31/18             Plan - 10/05/18 1751    Clinical Impression Statement  Pt presents to PT with complaints of Left foot drop that began one morning and did not resolve after surgical intervention. Pt demo significant weaknesses and gait deviations (see flowsheet) that are leading to biomechanical chain changes that are detrimental to other body parts as well as to functional ability. Discussed that he will purchase a bike rather than walk for strengthening/cardio and will do exercises daily. Due to severe lack of muscular activation, pt will benefit from AFO to avoid continued decrease in DF ROM. Showed pt available DF assist on [employer] that he can purchase in the interim but the support provided will not be sufficient. Pt will require greater than 10 visits approved at evaluation and the amount of time necessary to return him to PLOF was discussed with the patient.    Personal Factors and Comorbidities  Comorbidity 1    Comorbidities  DM    Examination-Activity Limitations  Sleep;Squat;Carry;Stairs;Stand;Dressing;Lift;Transfers;Locomotion Level    Examination-Participation Restrictions  Cleaning;Community Activity;Shop;Yard Work    Stability/Clinical Decision Making  Stable/Uncomplicated    Clinical Decision Making  Low    Rehab Potential  Good    PT Frequency  2x / week    PT Duration  12 weeks    PT Treatment/Interventions  ADLs/Self Care Home Management;Cryotherapy;Biomedical scientistlectrical  Stimulation;Gait training;Iontophoresis 4mg /ml Dexamethasone;Moist Heat;Stair training;Functional mobility training;Therapeutic activities;Therapeutic exercise;Balance training;Neuromuscular re-education;Manual techniques;Patient/family education;Passive range of motion;Dry needling;Taping    PT Next Visit Plan  [facility] hip strengthening, check ankle joint mobility, core activation    PT Home Exercise Plan  DF stretch, figure 4, ankle AROM (attempt), toe yoga/towel scrunches, prone HS curl, clam, standing weight shift    Recommended Other Services  AFO    Consulted and Agree with Plan of Care  Patient       Patient will benefit from skilled therapeutic intervention in order to improve the following deficits and impairments:  Abnormal gait, Decreased range of motion, Difficulty walking, Increased muscle spasms, Decreased endurance, Decreased activity tolerance,  Pain, Improper body mechanics, Impaired flexibility, Decreased balance, Decreased strength, Impaired sensation, Postural dysfunction  Visit Diagnosis: 1. Foot drop, left   2. Muscle weakness (generalized)   3. Other abnormalities of gait and mobility        Problem List Patient Active Problem List   Diagnosis Date Noted  . Hypertension   . Hyperlipidemia   . Diabetes mellitus without complication (HCC)   . GERD (gastroesophageal reflux disease)   . Vitamin D deficiency   . Encounter for long-term (current) use of other medications     Tunis Gentle C. Darnelle Derrick PT, DPT 10/05/18 6:05 PM   [facility] [address] [place], [place], [address] Phone: [phone]   Fax:  [phone]  Name: Royetta CarBernard Needles MRN: [id number] Date of Birth: 1970-12-28

## 2018-10-08 MED FILL — GABAPENTIN 300 MG CAPSULE: 300 | 30 days supply | Qty: 90 | Fill #0

## 2018-10-14 ENCOUNTER — Ambulatory Visit: Payer: PRIVATE HEALTH INSURANCE | Attending: Internal Medicine | Admitting: Physical Therapy

## 2018-10-14 ENCOUNTER — Encounter: Payer: Self-pay | Admitting: Physical Therapy

## 2018-10-14 ENCOUNTER — Other Ambulatory Visit: Payer: Self-pay

## 2018-10-14 DIAGNOSIS — M545 Low back pain: Secondary | ICD-10-CM | POA: Diagnosis present

## 2018-10-14 DIAGNOSIS — R2689 Other abnormalities of gait and mobility: Secondary | ICD-10-CM

## 2018-10-14 DIAGNOSIS — M6281 Muscle weakness (generalized): Secondary | ICD-10-CM | POA: Diagnosis present

## 2018-10-14 DIAGNOSIS — M21372 Foot drop, left foot: Secondary | ICD-10-CM

## 2018-10-14 NOTE — Therapy (Addendum)
[place], [place], [address] Phone: [phone]   Fax:  [phone]  Physical Therapy Treatment  Patient Details  Name: William Moran MRN: [id number] Date of Birth: [birth year] Referring Provider (PT): Erline Levine, MD   Encounter Date: 10/14/2018  PT End of Session - 10/14/18 0623    Visit Number  2    Number of Visits  25    Date for PT Re-Evaluation  12/31/18    Authorization - Visit Number  2    Authorization - Number of Visits  10    PT Start Time  7628    PT Stop Time  1316    PT Time Calculation (min)  41 min    Activity Tolerance  Patient tolerated treatment well    Behavior During Therapy  [facility] for tasks assessed/performed       Past Medical History:  Diagnosis Date  . Diabetes mellitus without complication ([place])   . Encounter for long-term (current) use of other medications   . GERD (gastroesophageal reflux disease)   . Hyperlipidemia   . Hypertension   . Vitamin D deficiency     Past Surgical History:  Procedure Laterality Date  . ANKLE SURGERY Right 2009  . OPEN REDUCTION INTERNAL FIXATION (ORIF) METACARPAL Left 05/25/2014   Procedure: OPEN REDUCTION INTERNAL FIXATION (ORIF) LEFT RING FINGER METACARPAL;  Surgeon: Daryll Brod, MD;  Location: [place];  Service: Orthopedics;  Laterality: Left;    There were no vitals filed for this visit.                    [place] Adult PT Treatment/Exercise - 10/14/18 0001      Knee/Hip Exercises: Stretches   Passive Hamstring Stretch Limitations  hamstring & gastroc 2x30s      Knee/Hip Exercises: Standing   Lateral Step Up Limitations  lateral weight shift into Lt foot on therapad    Other Standing Knee Exercises  Lt leg march with Rt arm fwd reach    Other Standing Knee Exercises  LATERAL LUNGES      Knee/Hip Exercises: Seated   Other Seated Knee/Hip Exercises  seated roundback, seated A 4lb      Knee/Hip  Exercises: Supine   Bridges  20 reps    Bridges Limitations  arms cross chest    Straight Leg Raise with External Rotation  Left;10 reps    Straight Leg Raise with External Rotation Limitations  alternating neutral & external rotation    Other Supine Knee/Hip Exercises  TrA with marching & with leg reach               PT Short Term Goals - 10/05/18 1758      PT SHORT TERM GOAL #1   Title  Pt will demo hip & knee strength gross 4/5    Baseline  see flowsheet    Time  6    Period  Weeks    Status  New    Target Date  11/19/18      PT SHORT TERM GOAL #2   Title  DF PROM +5    Baseline  -8 at eval    Time  6    Period  Weeks    Status  New    Target Date  11/19/18      PT SHORT TERM GOAL #3   Title  Pt will demo SLS with bilat UE support with level pelvis    Baseline  unable at eval    Time  6    Period  Weeks    Status  New    Target Date  11/19/18        PT Long Term Goals - 10/05/18 1801      PT LONG TERM GOAL #1   Title  Pt will ambulate without trendelenburg gait    Baseline  significant at eval- see flowsheet    Time  12    Period  Weeks    Status  New    Target Date  12/31/18      PT LONG TERM GOAL #2   Title  Gross hip/knee strength to 5/5    Baseline  see flowsheet    Time  12    Period  Weeks    Status  New    Target Date  12/31/18      PT LONG TERM GOAL #3   Title  Pt will demo ability to squat and return to stand while lifting objects    Baseline  lacking balance and strength necessary at eval    Time  12    Period  Weeks    Status  New    Target Date  12/31/18      PT LONG TERM GOAL #4   Title  pt will be able to push/pull weighted sled for return to work duties    Baseline  lacking necessary strength/ROM/Balance at eval    Time  12    Period  Weeks    Status  New    Target Date  12/31/18            Plan - 10/14/18 1421    Clinical Impression Statement  Continued to add to HEP for home strengthening. CKC focused on  static positioning of foot to focus on activation of whole LE. Multiple cues required for core activation to properly strengthen proximal support to provide distal stability. Visiting MD on 7/1, strongly recommend AFO at this time.    PT Treatment/Interventions  ADLs/Self Care Home Management;Cryotherapy;Clinical cytogeneticistlectrical Stimulation;Gait training;Iontophoresis 4mg /ml Dexamethasone;Moist Heat;Stair training;Functional mobility training;Therapeutic activities;Therapeutic exercise;Balance training;Neuromuscular re-education;Manual techniques;Patient/family education;Passive range of motion;Dry needling;Taping    PT Home Exercise Plan  DF stretch, figure 4, ankle AROM (attempt), toe yoga/towel scrunches, prone HS curl, clam, standing weight shift; SLR, bridge, TrA with LE ext, lateral shift on step, side lunges;    Consulted and Agree with Plan of Care  Patient       Patient will benefit from skilled therapeutic intervention in order to improve the following deficits and impairments:  Abnormal gait, Decreased range of motion, Difficulty walking, Increased muscle spasms, Decreased endurance, Decreased activity tolerance, Pain, Improper body mechanics, Impaired flexibility, Decreased balance, Decreased strength, Impaired sensation, Postural dysfunction  Visit Diagnosis: 1. Foot drop, left   2. Muscle weakness (generalized)   3. Other abnormalities of gait and mobility        Problem List Patient Active Problem List   Diagnosis Date Noted  . Hypertension   . Hyperlipidemia   . Diabetes mellitus without complication (HCC)   . GERD (gastroesophageal reflux disease)   . Vitamin D deficiency   . Encounter for long-term (current) use of other medications    Albertina Leise C. Dustie Brittle PT, DPT 10/14/18 3:16 PM   [facility] [address] [place], [place], [address] Phone: [phone]   Fax:  [phone]  Name: William Moran MRN: [id number] Date of  Birth: 1971-01-21

## 2018-10-15 ENCOUNTER — Ambulatory Visit: Payer: PRIVATE HEALTH INSURANCE | Admitting: Physical Therapy

## 2018-10-15 ENCOUNTER — Encounter: Payer: Self-pay | Admitting: Physical Therapy

## 2018-10-15 DIAGNOSIS — M545 Low back pain, unspecified: Secondary | ICD-10-CM

## 2018-10-15 DIAGNOSIS — R2689 Other abnormalities of gait and mobility: Secondary | ICD-10-CM

## 2018-10-15 DIAGNOSIS — M21372 Foot drop, left foot: Secondary | ICD-10-CM

## 2018-10-15 DIAGNOSIS — M6281 Muscle weakness (generalized): Secondary | ICD-10-CM

## 2018-10-15 NOTE — Therapy (Signed)
[facility] [address] [place], [place], [address] Phone: [phone]   Fax:  [phone]  Physical Therapy Treatment  Patient Details  Name: William Moran MRN: [id number] Date of Birth: 05-28-1970 Referring Provider (PT): Maeola HarmanJoseph Stern, MD   Encounter Date: 10/15/2018  PT End of Session - 10/15/18 1217    Visit Number  3    Number of Visits  25    Date for PT Re-Evaluation  12/31/18    Authorization Type  Workers Comp    Authorization - Visit Number  3    Authorization - Number of Visits  10    PT Start Time  1218    PT Stop Time  1258    PT Time Calculation (min)  40 min    Activity Tolerance  Patient tolerated treatment well;Patient limited by fatigue    Behavior During Therapy  [facility] for tasks assessed/performed       Past Medical History:  Diagnosis Date  . Diabetes mellitus without complication (HCC)   . Encounter for long-term (current) use of other medications   . GERD (gastroesophageal reflux disease)   . Hyperlipidemia   . Hypertension   . Vitamin D deficiency     Past Surgical History:  Procedure Laterality Date  . ANKLE SURGERY Right 2009  . OPEN REDUCTION INTERNAL FIXATION (ORIF) METACARPAL Left 05/25/2014   Procedure: OPEN REDUCTION INTERNAL FIXATION (ORIF) LEFT RING FINGER METACARPAL;  Surgeon: Cindee SaltGary Kuzma, MD;  Location: [facility];  Service: Orthopedics;  Laterality: Left;    There were no vitals filed for this visit.  Subjective Assessment - 10/15/18 1218    Subjective  "I am alittle sore, but not doing too bad"                       [facility] Adult PT Treatment/Exercise - 10/15/18 0001      Exercises   Exercises  Lumbar      Lumbar Exercises: Stretches   Passive Hamstring Stretch Limitations  hamstring & gastroc 2x30s      Lumbar Exercises: Seated   Other Seated Lumbar Exercises  seated marching on dyna-disc 2 x 20       Lumbar Exercises: Supine   Dead Bug   10 reps   x 2   Dead Bug Limitations  alternating lift/hold for 1 sec ea. L/R while keeping core tight      Lumbar Exercises: Quadruped   Other Quadruped Lumbar Exercises  rocking forward/ backward (on hands and knees) 1 x 15   frequent tactile cues for proper form     Knee/Hip Exercises: Standing   Lateral Step Up Limitations  lateral weight shift into Lt foot on fitter first rocker board and RLE on 2 inch step 2 x 15   Also practiced standing balance 3 x 30 seconds     Knee/Hip Exercises: Seated   Long Arc Quad  2 sets;15 reps;Weights   3$   Long Arc Quad Limitations  while seated on dyna-disc    Hamstring Curl  Strengthening;Left;2 sets   12 reps with green band   Hamstring Limitations  while seated on dyna disc to promote core activation      Knee/Hip Exercises: Supine   Bridges  1 set;20 reps   with arms crossed   KodiakBridges with Clamshell  2 sets;Strengthening;Both;10 reps   with green theraband     Ankle Exercises: Seated   Other Seated Ankle Exercises  rocker board  bil ankle DF/ PF 2 x 20    utilizing contralateral leg to assist with ROM/ activation              PT Short Term Goals - 10/05/18 1758      PT SHORT TERM GOAL #1   Title  Pt will demo hip & knee strength gross 4/5    Baseline  see flowsheet    Time  6    Period  Weeks    Status  New    Target Date  11/19/18      PT SHORT TERM GOAL #2   Title  DF PROM +5    Baseline  -8 at eval    Time  6    Period  Weeks    Status  New    Target Date  11/19/18      PT SHORT TERM GOAL #3   Title  Pt will demo SLS with bilat UE support with level pelvis    Baseline  unable at eval    Time  6    Period  Weeks    Status  New    Target Date  11/19/18        PT Long Term Goals - 10/05/18 1801      PT LONG TERM GOAL #1   Title  Pt will ambulate without trendelenburg gait    Baseline  significant at eval- see flowsheet    Time  12    Period  Weeks    Status  New    Target Date  12/31/18      PT  LONG TERM GOAL #2   Title  Gross hip/knee strength to 5/5    Baseline  see flowsheet    Time  12    Period  Weeks    Status  New    Target Date  12/31/18      PT LONG TERM GOAL #3   Title  Pt will demo ability to squat and return to stand while lifting objects    Baseline  lacking balance and strength necessary at eval    Time  12    Period  Weeks    Status  New    Target Date  12/31/18      PT LONG TERM GOAL #4   Title  pt will be able to push/pull weighted sled for return to work duties    Baseline  lacking necessary strength/ROM/Balance at eval    Time  12    Period  Weeks    Status  New    Target Date  12/31/18            Plan - 10/15/18 1302    Clinical Impression Statement  pt reported feeling sore but otherwise stated he was doing well. continued working LLE strengthening throghout the kinetic chain. He does fatigue quickly especially with core activation activities. continued working on lumbopelvic dissocation which he requreid frequent verbal and tactile cues. Reinforced emphasis on AFO benefits    PT Next Visit Plan  CKC hip strengthening, check ankle joint mobility, core activation    PT Home Exercise Plan  DF stretch, figure 4, ankle AROM (attempt), toe yoga/towel scrunches, prone HS curl, clam, standing weight shift; SLR, bridge, TrA with LE ext, lateral shift on step, side lunges;       Patient will benefit from skilled therapeutic intervention in order to improve the following deficits and impairments:  Abnormal gait, Decreased range of motion, Difficulty  walking, Increased muscle spasms, Decreased endurance, Decreased activity tolerance, Pain, Improper body mechanics, Impaired flexibility, Decreased balance, Decreased strength, Impaired sensation, Postural dysfunction  Visit Diagnosis: 1. Foot drop, left   2. Muscle weakness (generalized)   3. Other abnormalities of gait and mobility   4. Acute left-sided low back pain without sciatica        Problem  List Patient Active Problem List   Diagnosis Date Noted  . Hypertension   . Hyperlipidemia   . Diabetes mellitus without complication ([place])   . GERD (gastroesophageal reflux disease)   . Vitamin D deficiency   . Encounter for long-term (current) use of other medications    [name] PT, DPT, LAT, ATC  10/15/18  1:07 PM      [facility] [address] [place], [place], [address] Phone: [phone]   Fax:  [phone]  Name: William Moran MRN: [id number] Date of Birth: [birth year]

## 2018-10-19 ENCOUNTER — Other Ambulatory Visit: Payer: Self-pay

## 2018-10-19 ENCOUNTER — Ambulatory Visit: Payer: PRIVATE HEALTH INSURANCE | Admitting: Physical Therapy

## 2018-10-19 ENCOUNTER — Encounter: Payer: Self-pay | Admitting: Physical Therapy

## 2018-10-19 DIAGNOSIS — R2689 Other abnormalities of gait and mobility: Secondary | ICD-10-CM

## 2018-10-19 DIAGNOSIS — M6281 Muscle weakness (generalized): Secondary | ICD-10-CM

## 2018-10-19 DIAGNOSIS — M21372 Foot drop, left foot: Secondary | ICD-10-CM

## 2018-10-19 NOTE — Therapy (Signed)
[facility] [address] [place], [place], [address] Phone: [phone]   Fax:  [phone]  Physical Therapy Treatment  Patient Details  Name: William Moran MRN: [id number] Date of Birth: February 27, 1971 Referring Provider (PT): Maeola HarmanJoseph Stern, MD   Encounter Date: 10/19/2018  PT End of Session - 10/19/18 1040    Visit Number  4    Number of Visits  25    Date for PT Re-Evaluation  12/31/18    Authorization Type  Workers Comp    Authorization - Visit Number  4    Authorization - Number of Visits  10    PT Start Time  1034    PT Stop Time  1115    PT Time Calculation (min)  41 min    Activity Tolerance  Patient tolerated treatment well;Patient limited by fatigue    Behavior During Therapy  [facility] for tasks assessed/performed       Past Medical History:  Diagnosis Date  . Diabetes mellitus without complication (HCC)   . Encounter for long-term (current) use of other medications   . GERD (gastroesophageal reflux disease)   . Hyperlipidemia   . Hypertension   . Vitamin D deficiency     Past Surgical History:  Procedure Laterality Date  . ANKLE SURGERY Right 2009  . OPEN REDUCTION INTERNAL FIXATION (ORIF) METACARPAL Left 05/25/2014   Procedure: OPEN REDUCTION INTERNAL FIXATION (ORIF) LEFT RING FINGER METACARPAL;  Surgeon: Cindee SaltGary Kuzma, MD;  Location: [facility];  Service: Orthopedics;  Laterality: Left;    There were no vitals filed for this visit.  Subjective Assessment - 10/19/18 1037    Subjective  Denies pain today, exercises are challenging and I am feeling a little sore. Seeing MD with case worker tomorrow.    Currently in Pain?  No/denies                       [facility] Adult PT Treatment/Exercise - 10/19/18 0001      Lumbar Exercises: Aerobic   Nustep  5 min L7 Le only      Knee/Hip Exercises: Standing   Lateral Step Up Limitations  balance control- Lt foot on rocker board, Rt on 2"  step- static & dynamic    Functional Squat  3 sets;10 reps    Functional Squat Limitations  hanging from // bars    SLS  Rt hip hike, Lt foot on green therapad    Other Standing Knee Exercises  tandem balance    Other Standing Knee Exercises  static mini squat, legs together, with mini clams      Knee/Hip Exercises: Prone   Hamstring Curl  3 sets;10 reps    Hamstring Curl Limitations  stopping shy of full ext      Manual Therapy   Manual Therapy  Taping    Kinesiotex  Facilitate Muscle      Kinesiotix   Facilitate Muscle   anterior tibialis & HS Lt               PT Short Term Goals - 10/05/18 1758      PT SHORT TERM GOAL #1   Title  Pt will demo hip & knee strength gross 4/5    Baseline  see flowsheet    Time  6    Period  Weeks    Status  New    Target Date  11/19/18      PT SHORT TERM GOAL #2  Title  DF PROM +5    Baseline  -8 at eval    Time  6    Period  Weeks    Status  New    Target Date  11/19/18      PT SHORT TERM GOAL #3   Title  Pt will demo SLS with bilat UE support with level pelvis    Baseline  unable at eval    Time  6    Period  Weeks    Status  New    Target Date  11/19/18        PT Long Term Goals - 10/05/18 1801      PT LONG TERM GOAL #1   Title  Pt will ambulate without trendelenburg gait    Baseline  significant at eval- see flowsheet    Time  12    Period  Weeks    Status  New    Target Date  12/31/18      PT LONG TERM GOAL #2   Title  Gross hip/knee strength to 5/5    Baseline  see flowsheet    Time  12    Period  Weeks    Status  New    Target Date  12/31/18      PT LONG TERM GOAL #3   Title  Pt will demo ability to squat and return to stand while lifting objects    Baseline  lacking balance and strength necessary at eval    Time  12    Period  Weeks    Status  New    Target Date  12/31/18      PT LONG TERM GOAL #4   Title  pt will be able to push/pull weighted sled for return to work duties    Baseline   lacking necessary strength/ROM/Balance at eval    Time  12    Period  Weeks    Status  New    Target Date  12/31/18            Plan - 10/19/18 1119    Clinical Impression Statement  CKC exercises to encourage control of balance and avoiding hyperext of knee. placed Ktape in shortened positions of anterior tib and hamstrings.    PT Treatment/Interventions  ADLs/Self Care Home Management;Cryotherapy;Advice worker;Iontophoresis 4mg /ml Dexamethasone;Moist Heat;Stair training;Functional mobility training;Therapeutic activities;Therapeutic exercise;Balance training;Neuromuscular re-education;Manual techniques;Patient/family education;Passive range of motion;Dry needling;Taping    PT Next Visit Plan  proximal strengthening, what did MD say?    PT Home Exercise Plan  DF stretch, figure 4, ankle AROM (attempt), toe yoga/towel scrunches, prone HS curl, clam, standing weight shift; SLR, bridge, TrA with LE ext, lateral shift on step, side lunges;    Consulted and Agree with Plan of Care  Patient       Patient will benefit from skilled therapeutic intervention in order to improve the following deficits and impairments:  Abnormal gait, Decreased range of motion, Difficulty walking, Increased muscle spasms, Decreased endurance, Decreased activity tolerance, Pain, Improper body mechanics, Impaired flexibility, Decreased balance, Decreased strength, Impaired sensation, Postural dysfunction  Visit Diagnosis: 1. Foot drop, left   2. Muscle weakness (generalized)   3. Other abnormalities of gait and mobility        Problem List Patient Active Problem List   Diagnosis Date Noted  . Hypertension   . Hyperlipidemia   . Diabetes mellitus without complication ([place])   . GERD (gastroesophageal reflux disease)   . Vitamin D deficiency   .  Encounter for long-term (current) use of other medications     Kalee Broxton C. Lashondra Vaquerano PT, DPT 10/19/18 11:22 AM   [facility] [address] [place], [place], [address] Phone: [phone]   Fax:  [phone]  Name: William Moran MRN: [id number] Date of Birth: 01-17-1971

## 2018-10-21 ENCOUNTER — Encounter: Payer: Self-pay | Admitting: Physical Therapy

## 2018-10-21 ENCOUNTER — Ambulatory Visit: Payer: PRIVATE HEALTH INSURANCE | Attending: Neurosurgery | Admitting: Physical Therapy

## 2018-10-21 ENCOUNTER — Other Ambulatory Visit: Payer: Self-pay

## 2018-10-21 DIAGNOSIS — M545 Low back pain, unspecified: Secondary | ICD-10-CM

## 2018-10-21 DIAGNOSIS — M6281 Muscle weakness (generalized): Secondary | ICD-10-CM | POA: Insufficient documentation

## 2018-10-21 DIAGNOSIS — M21372 Foot drop, left foot: Secondary | ICD-10-CM | POA: Insufficient documentation

## 2018-10-21 DIAGNOSIS — R2689 Other abnormalities of gait and mobility: Secondary | ICD-10-CM | POA: Insufficient documentation

## 2018-10-21 NOTE — Therapy (Signed)
[facility] [address] [place], [place], [address] Phone: [phone]   Fax:  [phone]  Physical Therapy Treatment  Patient Details  Name: William Moran MRN: [id number] Date of Birth: 02/23/1971 Referring Provider (PT): Maeola HarmanJoseph Stern, MD   Encounter Date: 10/21/2018  PT End of Session - 10/21/18 1106    Visit Number  5    Number of Visits  25    Date for PT Re-Evaluation  12/31/18    Authorization Type  Workers Comp    Authorization - Visit Number  5    Authorization - Number of Visits  10    PT Start Time  1102    PT Stop Time  1145    PT Time Calculation (min)  43 min    Activity Tolerance  Patient tolerated treatment well    Behavior During Therapy  [facility] for tasks assessed/performed       Past Medical History:  Diagnosis Date  . Diabetes mellitus without complication (HCC)   . Encounter for long-term (current) use of other medications   . GERD (gastroesophageal reflux disease)   . Hyperlipidemia   . Hypertension   . Vitamin D deficiency     Past Surgical History:  Procedure Laterality Date  . ANKLE SURGERY Right 2009  . OPEN REDUCTION INTERNAL FIXATION (ORIF) METACARPAL Left 05/25/2014   Procedure: OPEN REDUCTION INTERNAL FIXATION (ORIF) LEFT RING FINGER METACARPAL;  Surgeon: Cindee SaltGary Kuzma, MD;  Location: [facility];  Service: Orthopedics;  Laterality: Left;    There were no vitals filed for this visit.  Subjective Assessment - 10/21/18 1106    Subjective  My foot is just tight today. MD is putting in request for AFO. I think Dr Venetia MaxonStern wants me to increase to 3x/week.    Currently in Pain?  No/denies                       [facility] - 10/21/18 0001      Pilates   Pilates Reformer  see PT note      Manual Therapy   Manual Therapy  Joint mobilization;Soft tissue mobilization    Joint Mobilization  talocrural distraction    Soft tissue mobilization   IASTM plantar aspect of foot, anterior tib    Kinesiotex  Facilitate Muscle      Kinesiotix   Facilitate Muscle   anterior tib        Pilates Reformer used for LE/core strength, postural strength, lumbopelvic disassociation and core control.  Exercises included:  Footwork: 2R1B double leg press neutral & ER, 1R1B single leg press neutral hugging opp knee & ER with opp leg TT, heel raises & calf stretch  Bridging: 2R1B ball bw knees  Sidelying press 1R            PT Short Term Goals - 10/05/18 1758      PT SHORT TERM GOAL #1   Title  Pt will demo hip & knee strength gross 4/5    Baseline  see flowsheet    Time  6    Period  Weeks    Status  New    Target Date  11/19/18      PT SHORT TERM GOAL #2   Title  DF PROM +5    Baseline  -8 at eval    Time  6    Period  Weeks    Status  New    Target Date  11/19/18      PT SHORT TERM GOAL #3   Title  Pt will demo SLS with bilat UE support with level pelvis    Baseline  unable at eval    Time  6    Period  Weeks    Status  New    Target Date  11/19/18        PT Long Term Goals - 10/05/18 1801      PT LONG TERM GOAL #1   Title  Pt will ambulate without trendelenburg gait    Baseline  significant at eval- see flowsheet    Time  12    Period  Weeks    Status  New    Target Date  12/31/18      PT LONG TERM GOAL #2   Title  Gross hip/knee strength to 5/5    Baseline  see flowsheet    Time  12    Period  Weeks    Status  New    Target Date  12/31/18      PT LONG TERM GOAL #3   Title  Pt will demo ability to squat and return to stand while lifting objects    Baseline  lacking balance and strength necessary at eval    Time  12    Period  Weeks    Status  New    Target Date  12/31/18      PT LONG TERM GOAL #4   Title  pt will be able to push/pull weighted sled for return to work duties    Baseline  lacking necessary strength/ROM/Balance at eval    Time  12    Period  Weeks    Status  New    Target  Date  12/31/18            Plan - 10/21/18 1127    Clinical Impression Statement  Challenged by reformer today with fatigue as expected but able to maintain good form with cuing. IASTM helpful with reducing tightness on plantar aspect of foot. Distal insertion of anterior tib is tender and inflammed from improper biomechanics.    PT Treatment/Interventions  ADLs/Self Care Home Management;Cryotherapy;Advice worker;Iontophoresis 4mg /ml Dexamethasone;Moist Heat;Stair training;Functional mobility training;Therapeutic activities;Therapeutic exercise;Balance training;Neuromuscular re-education;Manual techniques;Patient/family education;Passive range of motion;Dry needling;Taping    PT Next Visit Plan  [id number]    PT Home Exercise Plan  DF stretch, figure 4, ankle AROM (attempt), toe yoga/towel scrunches, prone HS curl, clam, standing weight shift; SLR, bridge, TrA with LE ext, lateral shift on step, side lunges;    Consulted and Agree with Plan of Care  Patient       Patient will benefit from skilled therapeutic intervention in order to improve the following deficits and impairments:  Abnormal gait, Decreased range of motion, Difficulty walking, Increased muscle spasms, Decreased endurance, Decreased activity tolerance, Pain, Improper body mechanics, Impaired flexibility, Decreased balance, Decreased strength, Impaired sensation, Postural dysfunction  Visit Diagnosis: 1. Foot drop, left   2. Muscle weakness (generalized)   3. Acute left-sided low back pain without sciatica   4. Other abnormalities of gait and mobility        Problem List Patient Active Problem List   Diagnosis Date Noted  . Hypertension   . Hyperlipidemia   . Diabetes mellitus without complication ([place])   . GERD (gastroesophageal reflux disease)   . Vitamin D deficiency   . Encounter for long-term (current) use of other medications     Malayla Granberry C.  Persephanie Laatsch PT, DPT 10/21/18 12:04  PM   [facility] [address] [place], [place], [address] Phone: [phone]   Fax:  [phone]  Name: William Moran MRN: [id number] Date of Birth: [birth year]

## 2018-10-25 ENCOUNTER — Encounter: Payer: Self-pay | Admitting: Physical Therapy

## 2018-10-25 ENCOUNTER — Other Ambulatory Visit: Payer: Self-pay

## 2018-10-25 ENCOUNTER — Ambulatory Visit: Payer: PRIVATE HEALTH INSURANCE | Admitting: Physical Therapy

## 2018-10-25 DIAGNOSIS — M21372 Foot drop, left foot: Secondary | ICD-10-CM | POA: Diagnosis not present

## 2018-10-25 DIAGNOSIS — M6281 Muscle weakness (generalized): Secondary | ICD-10-CM

## 2018-10-25 DIAGNOSIS — R2689 Other abnormalities of gait and mobility: Secondary | ICD-10-CM

## 2018-10-25 NOTE — Therapy (Signed)
[place], [place], [address] Phone: [phone]   Fax:  [phone]  Physical Therapy Treatment  Patient Details  Name: William Moran MRN: [id number] Date of Birth: [birth year] Referring Provider (PT): Erline Levine, MD   Encounter Date: 10/25/2018  PT End of Session - 10/25/18 1235    Visit Number  6    Number of Visits  25    Date for PT Re-Evaluation  12/31/18    Authorization Type  Workers Comp    Authorization - Visit Number  6    Authorization - Number of Visits  10    PT Start Time  1235    PT Stop Time  1313    PT Time Calculation (min)  38 min    Activity Tolerance  Patient tolerated treatment well    Behavior During Therapy  [facility] for tasks assessed/performed       Past Medical History:  Diagnosis Date  . Diabetes mellitus without complication ([place])   . Encounter for long-term (current) use of other medications   . GERD (gastroesophageal reflux disease)   . Hyperlipidemia   . Hypertension   . Vitamin D deficiency     Past Surgical History:  Procedure Laterality Date  . ANKLE SURGERY Right 2009  . OPEN REDUCTION INTERNAL FIXATION (ORIF) METACARPAL Left 05/25/2014   Procedure: OPEN REDUCTION INTERNAL FIXATION (ORIF) LEFT RING FINGER METACARPAL;  Surgeon: Daryll Brod, MD;  Location: [place];  Service: Orthopedics;  Laterality: Left;    There were no vitals filed for this visit.  Subjective Assessment - 10/25/18 1237    Subjective  Waiting to hear from case manager about AFO. Little spasms here and there but nothing major.    Currently in Pain?  No/denies         [facility] PT Assessment - 10/25/18 0001      PROM   Left Ankle Dorsiflexion  -4                   [facility] Adult PT Treatment/Exercise - 10/25/18 0001      Pilates   Pilates Reformer  see PT note        Pilates Reformer used for LE/core strength, postural strength, lumbopelvic disassociation  and core control.  Exercises included:  Footwork: 2R1B double leg press  Calf stretch    Pilates Tower for LE/Core strength, postural strength, lumbopelvic disassociation and core control.  Exercises included:  Supine Leg Springs: elastics single leg press, circles  Sidelying Leg [place]: elastics abd/add, rainbows  Arm [place]: straddle [place] press elastics  Qped hip abd leg off side of mat  Figure 4 stretch, HS stretch with elastics       PT Short Term Goals - 10/05/18 1758      PT SHORT TERM GOAL #1   Title  Pt will demo hip & knee strength gross 4/5    Baseline  see flowsheet    Time  6    Period  Weeks    Status  New    Target Date  11/19/18      PT SHORT TERM GOAL #2   Title  DF PROM +5    Baseline  -8 at eval    Time  6    Period  Weeks    Status  New    Target Date  11/19/18      PT SHORT TERM GOAL #3   Title  Pt will demo SLS  with bilat UE support with level pelvis    Baseline  unable at eval    Time  6    Period  Weeks    Status  New    Target Date  11/19/18        PT Long Term Goals - 10/05/18 1801      PT LONG TERM GOAL #1   Title  Pt will ambulate without trendelenburg gait    Baseline  significant at eval- see flowsheet    Time  12    Period  Weeks    Status  New    Target Date  12/31/18      PT LONG TERM GOAL #2   Title  Gross hip/knee strength to 5/5    Baseline  see flowsheet    Time  12    Period  Weeks    Status  New    Target Date  12/31/18      PT LONG TERM GOAL #3   Title  Pt will demo ability to squat and return to stand while lifting objects    Baseline  lacking balance and strength necessary at eval    Time  12    Period  Weeks    Status  New    Target Date  12/31/18      PT LONG TERM GOAL #4   Title  pt will be able to push/pull weighted sled for return to work duties    Baseline  lacking necessary strength/ROM/Balance at eval    Time  12    Period  Weeks    Status  New    Target Date  12/31/18             Plan - 10/25/18 1313    Clinical Impression Statement  Continued to challenge gross strength utilizing reformer and tower. significantly weak in core and guidance required for leg springs for lack of control.    PT Treatment/Interventions  ADLs/Self Care Home Management;Cryotherapy;Clinical cytogeneticistlectrical Stimulation;Gait training;Iontophoresis 4mg /ml Dexamethasone;Moist Heat;Stair training;Functional mobility training;Therapeutic activities;Therapeutic exercise;Balance training;Neuromuscular re-education;Manual techniques;Patient/family education;Passive range of motion;Dry needling;Taping    PT Next Visit Plan  TENS ant tib    PT Home Exercise Plan  DF stretch, figure 4, ankle AROM (attempt), toe yoga/towel scrunches, prone HS curl, clam, standing weight shift; SLR, bridge, TrA with LE ext, lateral shift on step, side lunges;    Consulted and Agree with Plan of Care  Patient       Patient will benefit from skilled therapeutic intervention in order to improve the following deficits and impairments:  Abnormal gait, Decreased range of motion, Difficulty walking, Increased muscle spasms, Decreased endurance, Decreased activity tolerance, Pain, Improper body mechanics, Impaired flexibility, Decreased balance, Decreased strength, Impaired sensation, Postural dysfunction  Visit Diagnosis: 1. Foot drop, left   2. Muscle weakness (generalized)   3. Other abnormalities of gait and mobility        Problem List Patient Active Problem List   Diagnosis Date Noted  . Hypertension   . Hyperlipidemia   . Diabetes mellitus without complication (HCC)   . GERD (gastroesophageal reflux disease)   . Vitamin D deficiency   . Encounter for long-term (current) use of other medications     Lacresha Fusilier C. Berley Gambrell PT, DPT 10/25/18 1:15 PM   [facility] [address] [place], [place], [address] Phone: [phone]   Fax:  [phone]  Name: William CarBernard  Moran MRN: [id number] Date of Birth: 28-Jul-1970

## 2018-10-26 ENCOUNTER — Ambulatory Visit: Payer: 59 | Admitting: Sports Medicine

## 2018-10-28 ENCOUNTER — Ambulatory Visit: Payer: PRIVATE HEALTH INSURANCE | Admitting: Physical Therapy

## 2018-10-28 ENCOUNTER — Other Ambulatory Visit: Payer: Self-pay

## 2018-10-28 DIAGNOSIS — M21372 Foot drop, left foot: Secondary | ICD-10-CM | POA: Diagnosis not present

## 2018-10-28 DIAGNOSIS — R2689 Other abnormalities of gait and mobility: Secondary | ICD-10-CM

## 2018-10-28 DIAGNOSIS — M6281 Muscle weakness (generalized): Secondary | ICD-10-CM

## 2018-10-28 NOTE — Therapy (Signed)
[place], [place], [address] Phone: [phone]   Fax:  [phone]  Physical Therapy Treatment  Patient Details  Name: William Moran MRN: [id number] Date of Birth: [birth year] Referring Provider (PT): Erline Levine, MD   Encounter Date: 10/28/2018  PT End of Session - 10/28/18 1004    Visit Number  7    Number of Visits  25    Date for PT Re-Evaluation  12/31/18    Authorization Type  Workers Comp    Authorization - Visit Number  7    Authorization - Number of Visits  10    PT Start Time  1005    PT Stop Time  1046    PT Time Calculation (min)  41 min    Activity Tolerance  Patient tolerated treatment well    Behavior During Therapy  [facility] for tasks assessed/performed       Past Medical History:  Diagnosis Date  . Diabetes mellitus without complication ([place])   . Encounter for long-term (current) use of other medications   . GERD (gastroesophageal reflux disease)   . Hyperlipidemia   . Hypertension   . Vitamin D deficiency     Past Surgical History:  Procedure Laterality Date  . ANKLE SURGERY Right 2009  . OPEN REDUCTION INTERNAL FIXATION (ORIF) METACARPAL Left 05/25/2014   Procedure: OPEN REDUCTION INTERNAL FIXATION (ORIF) LEFT RING FINGER METACARPAL;  Surgeon: Daryll Brod, MD;  Location: [place];  Service: Orthopedics;  Laterality: Left;    There were no vitals filed for this visit.  Subjective Assessment - 10/28/18 1006    Subjective  Still waiting to hear from case managef on AFO. Called again yesterday.    Currently in Pain?  No/denies                       [facility] Adult PT Treatment/Exercise - 10/28/18 0001      Pilates   Pilates Reformer  2R1B foot work toes/heels, SDLY single leg      Knee/Hip Exercises: Standing   Rocker Board  2 minutes    SLS  Rt hip hike, Lt foot on green therapad    SLS with Vectors  also SLS alone with 1 finger support    Other Standing Knee Exercises  tandem balance    Other Standing Knee Exercises         Knee/Hip Exercises: Seated   Long Arc Quad  2 sets;15 reps;Weights   3$     Knee/Hip Exercises: Prone   Hamstring Curl  3 sets;10 reps    Hamstring Curl Limitations  3#               PT Short Term Goals - 10/05/18 1758      PT SHORT TERM GOAL #1   Title  Pt will demo hip & knee strength gross 4/5    Baseline  see flowsheet    Time  6    Period  Weeks    Status  New    Target Date  11/19/18      PT SHORT TERM GOAL #2   Title  DF PROM +5    Baseline  -8 at eval    Time  6    Period  Weeks    Status  New    Target Date  11/19/18      PT SHORT TERM GOAL #3   Title  Pt will demo SLS with  bilat UE support with level pelvis    Baseline  unable at eval    Time  6    Period  Weeks    Status  New    Target Date  11/19/18        PT Long Term Goals - 10/05/18 1801      PT LONG TERM GOAL #1   Title  Pt will ambulate without trendelenburg gait    Baseline  significant at eval- see flowsheet    Time  12    Period  Weeks    Status  New    Target Date  12/31/18      PT LONG TERM GOAL #2   Title  Gross hip/knee strength to 5/5    Baseline  see flowsheet    Time  12    Period  Weeks    Status  New    Target Date  12/31/18      PT LONG TERM GOAL #3   Title  Pt will demo ability to squat and return to stand while lifting objects    Baseline  lacking balance and strength necessary at eval    Time  12    Period  Weeks    Status  New    Target Date  12/31/18      PT LONG TERM GOAL #4   Title  pt will be able to push/pull weighted sled for return to work duties    Baseline  lacking necessary strength/ROM/Balance at eval    Time  12    Period  Weeks    Status  New    Target Date  12/31/18            Plan - 10/28/18 1053    Clinical Impression Statement  Patient did well today with TE/balance.    PT Treatment/Interventions  ADLs/Self Care Home  Management;Cryotherapy;Clinical cytogeneticistlectrical Stimulation;Gait training;Iontophoresis 4mg /ml Dexamethasone;Moist Heat;Stair training;Functional mobility training;Therapeutic activities;Therapeutic exercise;Balance training;Neuromuscular re-education;Manual techniques;Patient/family education;Passive range of motion;Dry needling;Taping    PT Next Visit Plan  TENS ant tib       Patient will benefit from skilled therapeutic intervention in order to improve the following deficits and impairments:  Abnormal gait, Decreased range of motion, Difficulty walking, Increased muscle spasms, Decreased endurance, Decreased activity tolerance, Pain, Improper body mechanics, Impaired flexibility, Decreased balance, Decreased strength, Impaired sensation, Postural dysfunction  Visit Diagnosis: 1. Foot drop, left   2. Muscle weakness (generalized)   3. Other abnormalities of gait and mobility        Problem List Patient Active Problem List   Diagnosis Date Noted  . Hypertension   . Hyperlipidemia   . Diabetes mellitus without complication (HCC)   . GERD (gastroesophageal reflux disease)   . Vitamin D deficiency   . Encounter for long-term (current) use of other medications     Solon PalmJulie Bernadine Melecio PT 10/28/2018, 10:54 AM  [facility] [address] [place], [place], [address] Phone: [phone]   Fax:  [phone]  Name: William Moran MRN: [id number] Date of Birth: Jun 03, 1970

## 2018-11-02 ENCOUNTER — Ambulatory Visit: Payer: PRIVATE HEALTH INSURANCE | Admitting: Physical Therapy

## 2018-11-02 ENCOUNTER — Other Ambulatory Visit: Payer: Self-pay

## 2018-11-02 ENCOUNTER — Encounter: Payer: Self-pay | Admitting: Physical Therapy

## 2018-11-02 DIAGNOSIS — M6281 Muscle weakness (generalized): Secondary | ICD-10-CM

## 2018-11-02 DIAGNOSIS — R2689 Other abnormalities of gait and mobility: Secondary | ICD-10-CM

## 2018-11-02 DIAGNOSIS — M21372 Foot drop, left foot: Secondary | ICD-10-CM

## 2018-11-02 NOTE — Therapy (Signed)
[facility] [address] [place], [place], [address] Phone: [phone]   Fax:  [phone]  Physical Therapy Treatment  Patient Details  Name: William Moran MRN: [id number] Date of Birth: [birth year] Referring Provider (PT): William HarmanJoseph Stern, MD   Encounter Date: 11/02/2018  PT End of Session - 11/02/18 1034    Visit Number  8    Number of Visits  25    Date for PT Re-Evaluation  12/31/18    Authorization Type  Workers Comp    Authorization - Visit Number  8    Authorization - Number of Visits  10    PT Start Time  1031    PT Stop Time  1111    PT Time Calculation (min)  40 min    Activity Tolerance  Patient tolerated treatment well    Behavior During Therapy  [facility] for tasks assessed/performed       Past Medical History:  Diagnosis Date  . Diabetes mellitus without complication (HCC)   . Encounter for long-term (current) use of other medications   . GERD (gastroesophageal reflux disease)   . Hyperlipidemia   . Hypertension   . Vitamin D deficiency     Past Surgical History:  Procedure Laterality Date  . ANKLE SURGERY Right 2009  . OPEN REDUCTION INTERNAL FIXATION (ORIF) METACARPAL Left 05/25/2014   Procedure: OPEN REDUCTION INTERNAL FIXATION (ORIF) LEFT RING FINGER METACARPAL;  Surgeon: William SaltGary Kuzma, MD;  Location: [facility];  Service: Orthopedics;  Laterality: Left;    There were no vitals filed for this visit.  Subjective Assessment - 11/02/18 1033    Subjective  no pain, just the numbness and tightness. hip and knee are sore.    Currently in Pain?  No/denies                       [facility] Adult PT Treatment/Exercise - 11/02/18 0001      Knee/Hip Exercises: Aerobic   Nustep  5 min L8 Le only      Knee/Hip Exercises: Sidelying   Hip ABduction Limitations  abduction +hip ext    Clams  x20      Knee/Hip Exercises: Prone   Other Prone Exercises  IR/ER knees to 90 deg      Modalities   Modalities  Electrical Stimulation      Electrical Stimulation   Electrical Stimulation Location  Lt ant tib    Engineer, manufacturinglectrical Stimulation Action  russian- attended and paired with DF motion    Electrical Stimulation Parameters  10/50, 60 freq, 63 intensity    Electrical Stimulation Goals  Neuromuscular facilitation               PT Short Term Goals - 10/05/18 1758      PT SHORT TERM GOAL #1   Title  Pt will demo hip & knee strength gross 4/5    Baseline  see flowsheet    Time  6    Period  Weeks    Status  New    Target Date  11/19/18      PT SHORT TERM GOAL #2   Title  DF PROM +5    Baseline  -8 at eval    Time  6    Period  Weeks    Status  New    Target Date  11/19/18      PT SHORT TERM GOAL #3   Title  Pt will demo SLS with bilat  UE support with level pelvis    Baseline  unable at eval    Time  6    Period  Weeks    Status  New    Target Date  11/19/18        PT Long Term Goals - 10/05/18 1801      PT LONG TERM GOAL #1   Title  Pt will ambulate without trendelenburg gait    Baseline  significant at eval- see flowsheet    Time  12    Period  Weeks    Status  New    Target Date  12/31/18      PT LONG TERM GOAL #2   Title  Gross hip/knee strength to 5/5    Baseline  see flowsheet    Time  12    Period  Weeks    Status  New    Target Date  12/31/18      PT LONG TERM GOAL #3   Title  Pt will demo ability to squat and return to stand while lifting objects    Baseline  lacking balance and strength necessary at eval    Time  12    Period  Weeks    Status  New    Target Date  12/31/18      PT LONG TERM GOAL #4   Title  pt will be able to push/pull weighted sled for return to work duties    Baseline  lacking necessary strength/ROM/Balance at eval    Time  12    Period  Weeks    Status  New    Target Date  12/31/18            Plan - 11/02/18 1113    Clinical Impression Statement  Was unable to perform DF motion independently  while using [place] stim today. Will try repeating at next visit. Continued to focus on proximal strengthening to reduce stress on hip and knee. PT will contact case mgr regarding AFO.    PT Treatment/Interventions  ADLs/Self Care Home Management;Cryotherapy;Advice worker;Iontophoresis 4mg /ml Dexamethasone;Moist Heat;Stair training;Functional mobility training;Therapeutic activities;Therapeutic exercise;Balance training;Neuromuscular re-education;Manual techniques;Patient/family education;Passive range of motion;Dry needling;Taping    PT Next Visit Plan  repeat russian, core strengthening    PT Home Exercise Plan  DF stretch, figure 4, ankle AROM (attempt), toe yoga/towel scrunches, prone HS curl, clam, standing weight shift; SLR, bridge, TrA with LE ext, lateral shift on step, side lunges;    Consulted and Agree with Plan of Care  Patient       Patient will benefit from skilled therapeutic intervention in order to improve the following deficits and impairments:  Abnormal gait, Decreased range of motion, Difficulty walking, Increased muscle spasms, Decreased endurance, Decreased activity tolerance, Pain, Improper body mechanics, Impaired flexibility, Decreased balance, Decreased strength, Impaired sensation, Postural dysfunction  Visit Diagnosis: 1. Foot drop, left   2. Muscle weakness (generalized)   3. Other abnormalities of gait and mobility        Problem List Patient Active Problem List   Diagnosis Date Noted  . Hypertension   . Hyperlipidemia   . Diabetes mellitus without complication ([place])   . GERD (gastroesophageal reflux disease)   . Vitamin D deficiency   . Encounter for long-term (current) use of other medications     William Moran PT, DPT 11/02/18 11:21 AM   [facility] [address] [place], [place], [address] Phone: [phone]   Fax:  [phone]  Name: William Moran MRN:  [id number] Date of Birth: 08-Aug-1970

## 2018-11-04 ENCOUNTER — Ambulatory Visit: Payer: PRIVATE HEALTH INSURANCE | Admitting: Physical Therapy

## 2018-11-04 ENCOUNTER — Other Ambulatory Visit: Payer: Self-pay

## 2018-11-04 ENCOUNTER — Encounter: Payer: Self-pay | Admitting: Physical Therapy

## 2018-11-04 DIAGNOSIS — M21372 Foot drop, left foot: Secondary | ICD-10-CM | POA: Diagnosis not present

## 2018-11-04 DIAGNOSIS — R2689 Other abnormalities of gait and mobility: Secondary | ICD-10-CM

## 2018-11-04 DIAGNOSIS — M545 Low back pain, unspecified: Secondary | ICD-10-CM

## 2018-11-04 DIAGNOSIS — R39198 Other difficulties with micturition: Secondary | ICD-10-CM | POA: Diagnosis not present

## 2018-11-04 DIAGNOSIS — E1165 Type 2 diabetes mellitus with hyperglycemia: Secondary | ICD-10-CM | POA: Diagnosis not present

## 2018-11-04 DIAGNOSIS — I10 Essential (primary) hypertension: Secondary | ICD-10-CM | POA: Diagnosis not present

## 2018-11-04 DIAGNOSIS — E782 Mixed hyperlipidemia: Secondary | ICD-10-CM | POA: Diagnosis not present

## 2018-11-04 DIAGNOSIS — M6281 Muscle weakness (generalized): Secondary | ICD-10-CM

## 2018-11-04 NOTE — Therapy (Signed)
[facility] [address] [place], [place], [address] Phone: [phone]   Fax:  [phone]  Physical Therapy Treatment  Patient Details  Name: William Moran MRN: [id number] Date of Birth: 04-04-1971 Referring Provider (PT): Maeola HarmanJoseph Stern, MD   Encounter Date: 11/04/2018  PT End of Session - 11/04/18 0936    Visit Number  9    Number of Visits  25    Date for PT Re-Evaluation  12/31/18    Authorization Type  Workers Comp    Authorization - Visit Number  9    Authorization - Number of Visits  10    PT Start Time  0933    PT Stop Time  1015    PT Time Calculation (min)  42 min    Activity Tolerance  Patient tolerated treatment well    Behavior During Therapy  [facility] for tasks assessed/performed       Past Medical History:  Diagnosis Date  . Diabetes mellitus without complication (HCC)   . Encounter for long-term (current) use of other medications   . GERD (gastroesophageal reflux disease)   . Hyperlipidemia   . Hypertension   . Vitamin D deficiency     Past Surgical History:  Procedure Laterality Date  . ANKLE SURGERY Right 2009  . OPEN REDUCTION INTERNAL FIXATION (ORIF) METACARPAL Left 05/25/2014   Procedure: OPEN REDUCTION INTERNAL FIXATION (ORIF) LEFT RING FINGER METACARPAL;  Surgeon: Cindee SaltGary Kuzma, MD;  Location: [facility];  Service: Orthopedics;  Laterality: Left;    There were no vitals filed for this visit.  Subjective Assessment - 11/04/18 0938    Subjective  "I think I am maybe getting alittle better, I just know this is going to take a period of time"    Currently in Pain?  No/denies         [facility] PT Assessment - 11/04/18 0001      Assessment   Medical Diagnosis  Left foot drop                   [facility] Adult PT Treatment/Exercise - 11/04/18 0001      Knee/Hip Exercises: Stretches   Gastroc Stretch  2 reps;30 seconds      Knee/Hip Exercises: Aerobic   Nustep  L6 x 5 min  LE only   with AFO on LLE     Electrical Stimulation   Electrical Stimulation Location  Lt ant tib    Statisticianlectrical Stimulation Action  NMS    Electrical Stimulation Parameters  10/10, ramp 2, frequency 100, phase duratoin 300, L8 x 10 min    Electrical Stimulation Goals  Neuromuscular facilitation   in supine on bolster     Manual Therapy   Joint Mobilization  talocrural distraction grade 5, PA/ AP mobs grade III       Ankle Exercises: Standing   Other Standing Ankle Exercises  walking in // 8 x focusing on heel strike/ toe off utilizing clinic AFO   using bil UE PRN    Other Standing Ankle Exercises  ambulate 185 ft  with clinic AFO following gait in //             PT Education - 11/04/18 1020    Education Details  benefits of AFO and to work on calling his MD to see if he can get referral pushed through.    Person(s) Educated  Patient    Methods  Explanation    Comprehension  Verbalized understanding  PT Short Term Goals - 10/05/18 1758      PT SHORT TERM GOAL #1   Title  Pt will demo hip & knee strength gross 4/5    Baseline  see flowsheet    Time  6    Period  Weeks    Status  New    Target Date  11/19/18      PT SHORT TERM GOAL #2   Title  DF PROM +5    Baseline  -8 at eval    Time  6    Period  Weeks    Status  New    Target Date  11/19/18      PT SHORT TERM GOAL #3   Title  Pt will demo SLS with bilat UE support with level pelvis    Baseline  unable at eval    Time  6    Period  Weeks    Status  New    Target Date  11/19/18        PT Long Term Goals - 10/05/18 1801      PT LONG TERM GOAL #1   Title  Pt will ambulate without trendelenburg gait    Baseline  significant at eval- see flowsheet    Time  12    Period  Weeks    Status  New    Target Date  12/31/18      PT LONG TERM GOAL #2   Title  Gross hip/knee strength to 5/5    Baseline  see flowsheet    Time  12    Period  Weeks    Status  New    Target Date  12/31/18      PT  LONG TERM GOAL #3   Title  Pt will demo ability to squat and return to stand while lifting objects    Baseline  lacking balance and strength necessary at eval    Time  12    Period  Weeks    Status  New    Target Date  12/31/18      PT LONG TERM GOAL #4   Title  pt will be able to push/pull weighted sled for return to work duties    Baseline  lacking necessary strength/ROM/Balance at eval    Time  12    Period  Weeks    Status  New    Target Date  12/31/18            Plan - 11/04/18 1010    Clinical Impression Statement  Continued working talocrural ROM with mobs utilized NMS today bracketing anterior tib to promote DF. utilized clinic AFO and practiced gait training which pt noted significant relief of tension on the hip and was able to demonstrate improved heel strike/ toe off.    PT Next Visit Plan  repeat NMS, core strengthening, ankle MOBS, did he get in contact with MD/ orthotist.    PT Home Exercise Plan  DF stretch, figure 4, ankle AROM (attempt), toe yoga/towel scrunches, prone HS curl, clam, standing weight shift; SLR, bridge, TrA with LE ext, lateral shift on step, side lunges;    Consulted and Agree with Plan of Care  Patient       Patient will benefit from skilled therapeutic intervention in order to improve the following deficits and impairments:  Abnormal gait, Decreased range of motion, Difficulty walking, Increased muscle spasms, Decreased endurance, Decreased activity tolerance, Pain, Improper body mechanics, Impaired flexibility, Decreased balance, Decreased  strength, Impaired sensation, Postural dysfunction  Visit Diagnosis: 1. Foot drop, left   2. Muscle weakness (generalized)   3. Other abnormalities of gait and mobility   4. Acute left-sided low back pain without sciatica        Problem List Patient Active Problem List   Diagnosis Date Noted  . Hypertension   . Hyperlipidemia   . Diabetes mellitus without complication ([place])   . GERD  (gastroesophageal reflux disease)   . Vitamin D deficiency   . Encounter for long-term (current) use of other medications    [name] PT, DPT, LAT, ATC  11/04/18  10:23 AM      [place], [place], [address] Phone: [phone]   Fax:  [phone]  Name: William Moran MRN: [id number] Date of Birth: 06/20/1970

## 2018-11-09 ENCOUNTER — Other Ambulatory Visit: Payer: Self-pay

## 2018-11-09 ENCOUNTER — Encounter: Payer: Self-pay | Admitting: Physical Therapy

## 2018-11-09 ENCOUNTER — Ambulatory Visit: Payer: PRIVATE HEALTH INSURANCE | Admitting: Physical Therapy

## 2018-11-09 DIAGNOSIS — M21372 Foot drop, left foot: Secondary | ICD-10-CM | POA: Diagnosis not present

## 2018-11-09 DIAGNOSIS — M6281 Muscle weakness (generalized): Secondary | ICD-10-CM

## 2018-11-09 DIAGNOSIS — R2689 Other abnormalities of gait and mobility: Secondary | ICD-10-CM

## 2018-11-09 NOTE — Therapy (Signed)
[place], [place], [address] Phone: [phone]   Fax:  [phone]  Physical Therapy Treatment  Patient Details  Name: William Moran MRN: [id number] Date of Birth: 1971-01-25 Referring Provider (PT): Erline Levine, MD   Encounter Date: 11/09/2018  PT End of Session - 11/09/18 1040    Visit Number  10    Number of Visits  25    Date for PT Re-Evaluation  12/31/18    Authorization Type  Workers Comp    Authorization - Visit Number  10    Authorization - Number of Visits  10    PT Start Time  1035    PT Stop Time  1119    PT Time Calculation (min)  44 min    Activity Tolerance  Patient tolerated treatment well    Behavior During Therapy  [facility] for tasks assessed/performed       Past Medical History:  Diagnosis Date  . Diabetes mellitus without complication ([place])   . Encounter for long-term (current) use of other medications   . GERD (gastroesophageal reflux disease)   . Hyperlipidemia   . Hypertension   . Vitamin D deficiency     Past Surgical History:  Procedure Laterality Date  . ANKLE SURGERY Right 2009  . OPEN REDUCTION INTERNAL FIXATION (ORIF) METACARPAL Left 05/25/2014   Procedure: OPEN REDUCTION INTERNAL FIXATION (ORIF) LEFT RING FINGER METACARPAL;  Surgeon: Daryll Brod, MD;  Location: [place];  Service: Orthopedics;  Laterality: Left;    There were no vitals filed for this visit.  Subjective Assessment - 11/09/18 1036    Subjective  Fitted for my AFO yesterday, should be in in about a week.    Currently in Pain?  No/denies         [facility] PT Assessment - 11/09/18 0001      Assessment   Medical Diagnosis  Left foot drop    Referring Provider (PT)  Erline Levine, MD    Onset Date/Surgical Date  08/20/18      PROM   Left Ankle Dorsiflexion  2      Strength   Left Hip Flexion  4+/5    Left Hip Extension  4-/5    Left Hip ABduction  3+/5    Left Knee Flexion   4+/5    Left Knee Extension  4+/5                   [facility] Adult PT Treatment/Exercise - 11/09/18 0001      Lumbar Exercises: Aerobic   Elliptical  5 min L1       Knee/Hip Exercises: Stretches   Hip Flexor Stretch  Both;2 reps;30 seconds    Hip Flexor Stretch Limitations  lunge stretch      Knee/Hip Exercises: Standing   Hip Extension  Left;2 sets;20 reps;Knee bent    Extension Limitations  holding ball behind knee    Lateral Step Up  20 reps;Step Height: 2"    Lateral Step Up Limitations  with right hip hike      Knee/Hip Exercises: Sidelying   Hip ABduction Limitations  knee to chest & extend on back diagonal    Clams  x30      Manual Therapy   Soft tissue mobilization  IASTM Lt gastroc & Lt plantar foot               PT Short Term Goals - 11/09/18 1047  PT SHORT TERM GOAL #1   Title  Pt will demo hip & knee strength gross 4/5    Baseline  see flowsheet    Status  Partially Met    Target Date  11/19/18      PT SHORT TERM GOAL #2   Title  DF PROM +5    Target Date  11/19/18      PT SHORT TERM GOAL #3   Title  Pt will demo SLS with bilat UE support with level pelvis    Baseline  able to obtain position but unable to hold due to fatigue    Status  Partially Met    Target Date  11/19/18        PT Long Term Goals - 10/05/18 1801      PT LONG TERM GOAL #1   Title  Pt will ambulate without trendelenburg gait    Baseline  significant at eval- see flowsheet    Time  12    Period  Weeks    Status  New    Target Date  12/31/18      PT LONG TERM GOAL #2   Title  Gross hip/knee strength to 5/5    Baseline  see flowsheet    Time  12    Period  Weeks    Status  New    Target Date  12/31/18      PT LONG TERM GOAL #3   Title  Pt will demo ability to squat and return to stand while lifting objects    Baseline  lacking balance and strength necessary at eval    Time  12    Period  Weeks    Status  New    Target Date  12/31/18      PT  LONG TERM GOAL #4   Title  pt will be able to push/pull weighted sled for return to work duties    Baseline  lacking necessary strength/ROM/Balance at eval    Time  12    Period  Weeks    Status  New    Target Date  12/31/18            Plan - 11/09/18 1127    Clinical Impression Statement  Utilized eliptical for strengthening where he was able to avoid extreme varus in his knee. Pt demo some improvement in strength, slow, as expected, but is obviously getting stronger in proximal musculature. Pt will continue to benefit from skilled PT for proper strengthening to return to functional levels. Pt asked if this would ever get better- I told him that it could possibly but I have no way of telling him how long. He asked if he should have a NCV test and we discussed that it likely would not change the course of treatment.    PT Treatment/Interventions  ADLs/Self Care Home Management;Cryotherapy;Advice worker;Iontophoresis 33m/ml Dexamethasone;Moist Heat;Stair training;Functional mobility training;Therapeutic activities;Therapeutic exercise;Balance training;Neuromuscular re-education;Manual techniques;Patient/family education;Passive range of motion;Dry needling;Taping    PT Home Exercise Plan  DF stretch, figure 4, ankle AROM (attempt), toe yoga/towel scrunches, prone HS curl, clam, standing weight shift; SLR, bridge, TrA with LE ext, lateral shift on step, side lunges; hip ext with ball, sidelying kick to hip ext, hip flexor stretch.    Consulted and Agree with Plan of Care  Patient       Patient will benefit from skilled therapeutic intervention in order to improve the following deficits and impairments:  Abnormal gait, Decreased range of motion,  Difficulty walking, Increased muscle spasms, Decreased endurance, Decreased activity tolerance, Pain, Improper body mechanics, Impaired flexibility, Decreased balance, Decreased strength, Impaired sensation, Postural  dysfunction  Visit Diagnosis: 1. Foot drop, left   2. Muscle weakness (generalized)   3. Other abnormalities of gait and mobility        Problem List Patient Active Problem List   Diagnosis Date Noted  . Hypertension   . Hyperlipidemia   . Diabetes mellitus without complication ([place])   . GERD (gastroesophageal reflux disease)   . Vitamin D deficiency   . Encounter for long-term (current) use of other medications     Shamiah Kahler C. Kailynn Satterly PT, DPT 11/09/18 12:21 PM   [place] [facility] [address] [place], [place], [address] Phone: [phone]   Fax:  [phone]  Name: William Moran MRN: [id number] Date of Birth: Mar 20, 1971

## 2018-11-11 ENCOUNTER — Ambulatory Visit: Payer: PRIVATE HEALTH INSURANCE | Admitting: Physical Therapy

## 2018-11-15 DIAGNOSIS — E782 Mixed hyperlipidemia: Secondary | ICD-10-CM | POA: Diagnosis not present

## 2018-11-15 DIAGNOSIS — I10 Essential (primary) hypertension: Secondary | ICD-10-CM | POA: Diagnosis not present

## 2018-11-15 DIAGNOSIS — G5732 Lesion of lateral popliteal nerve, left lower limb: Secondary | ICD-10-CM | POA: Diagnosis not present

## 2018-11-15 DIAGNOSIS — Z7189 Other specified counseling: Secondary | ICD-10-CM | POA: Diagnosis not present

## 2018-11-15 DIAGNOSIS — E1165 Type 2 diabetes mellitus with hyperglycemia: Secondary | ICD-10-CM | POA: Diagnosis not present

## 2018-11-17 ENCOUNTER — Other Ambulatory Visit: Payer: Self-pay

## 2018-11-17 ENCOUNTER — Encounter: Payer: Self-pay | Admitting: Physical Therapy

## 2018-11-17 ENCOUNTER — Ambulatory Visit: Payer: PRIVATE HEALTH INSURANCE | Admitting: Physical Therapy

## 2018-11-17 DIAGNOSIS — M6281 Muscle weakness (generalized): Secondary | ICD-10-CM

## 2018-11-17 DIAGNOSIS — M21372 Foot drop, left foot: Secondary | ICD-10-CM | POA: Diagnosis not present

## 2018-11-17 DIAGNOSIS — M545 Low back pain, unspecified: Secondary | ICD-10-CM

## 2018-11-17 DIAGNOSIS — R2689 Other abnormalities of gait and mobility: Secondary | ICD-10-CM

## 2018-11-17 NOTE — Therapy (Addendum)
[place], [place], [address] Phone: [phone]   Fax:  [phone]  Physical Therapy Treatment  Patient Details  Name: William Moran MRN: [id number] Date of Birth: 1971-03-13 Referring Provider (PT): Erline Levine, MD   Encounter Date: 11/17/2018  PT End of Session - 11/17/18 1019    Visit Number  11    Number of Visits  25    Date for PT Re-Evaluation  12/31/18    Authorization Type  Workers Comp    Authorization - Visit Number  11    Authorization - Number of Visits  20    PT Start Time  1020    PT Stop Time  1059    PT Time Calculation (min)  39 min    Activity Tolerance  Patient tolerated treatment well    Behavior During Therapy  WFL for tasks assessed/performed       Past Medical History:  Diagnosis Date  . Diabetes mellitus without complication ([place])   . Encounter for long-term (current) use of other medications   . GERD (gastroesophageal reflux disease)   . Hyperlipidemia   . Hypertension   . Vitamin D deficiency     Past Surgical History:  Procedure Laterality Date  . ANKLE SURGERY Right 2009  . OPEN REDUCTION INTERNAL FIXATION (ORIF) METACARPAL Left 05/25/2014   Procedure: OPEN REDUCTION INTERNAL FIXATION (ORIF) LEFT RING FINGER METACARPAL;  Surgeon: Daryll Brod, MD;  Location: [place];  Service: Orthopedics;  Laterality: Left;    There were no vitals filed for this visit.  Subjective Assessment - 11/17/18 1023    Subjective  " I think the AFO is coming in tomorrow, I am only having mild discomfort at 4/10"    Currently in Pain?  Yes    Pain Score  4     Pain Location  Ankle    Pain Orientation  Left    Pain Descriptors / Indicators  Aching                       [facility] Adult PT Treatment/Exercise - 11/17/18 0001      Lumbar Exercises: Aerobic   Elliptical  5 min L1       Knee/Hip Exercises: Machines for Strengthening   Cybex Leg Press   1 x 20 20# bil LE, 1 x 12 20# con bil- [facility] LLE only      Knee/Hip Exercises: Standing   Lateral Step Up  20 reps;Step Height: 2";2 sets;Step Height: 4"   started with 2inch step x 1 set, progressed to 4 inch step   Lateral Step Up Limitations  with right hip hike    Forward Step Up  2 sets;20 reps;Step Height: 2";Step Height: 4"   started with 2inch step x 1 set, progressed to 4 inch step   Forward Step Up Limitations  avoding on utilzing forced knee extension/ snapping  when stepping up      Manual Therapy   Manual therapy comments  MTPR along gastroc/ soleus x 4 ea.    Joint Mobilization  talocrural distraction grade 5, PA/ AP mobs grade III     Soft tissue mobilization  IASTM Lt gastroc & Lt plantar foot          Balance Exercises - 11/17/18 1039      Balance Exercises: Standing   Other Standing Exercises  1/2 kneeling on the table 4 x 30 sec   tactile cues for  stability         PT Short Term Goals - 11/09/18 1047      PT SHORT TERM GOAL #1   Title  Pt will demo hip & knee strength gross 4/5    Baseline  see flowsheet    Status  Partially Met    Target Date  11/19/18      PT SHORT TERM GOAL #2   Title  DF PROM +5    Target Date  11/19/18      PT SHORT TERM GOAL #3   Title  Pt will demo SLS with bilat UE support with level pelvis    Baseline  able to obtain position but unable to hold due to fatigue    Status  Partially Met    Target Date  11/19/18        PT Long Term Goals - 10/05/18 1801      PT LONG TERM GOAL #1   Title  Pt will ambulate without trendelenburg gait    Baseline  significant at eval- see flowsheet    Time  12    Period  Weeks    Status  New    Target Date  12/31/18      PT LONG TERM GOAL #2   Title  Gross hip/knee strength to 5/5    Baseline  see flowsheet    Time  12    Period  Weeks    Status  New    Target Date  12/31/18      PT LONG TERM GOAL #3   Title  Pt will demo ability to squat and return to stand while lifting  objects    Baseline  lacking balance and strength necessary at eval    Time  12    Period  Weeks    Status  New    Target Date  12/31/18      PT LONG TERM GOAL #4   Title  pt will be able to push/pull weighted sled for return to work duties    Baseline  lacking necessary strength/ROM/Balance at eval    Time  12    Period  Weeks    Status  New    Target Date  12/31/18            Plan - 11/17/18 1059    Clinical Impression Statement  continued progression of strengthening while utilzing clinic AFO. pt reports he is to go pick up his fitted AFO on Thursday. continued proximal strengthening and worked on hip strategy balance which he did not increased postural sway. no pain noted at end of session.    PT Treatment/Interventions  ADLs/Self Care Home Management;Cryotherapy;Advice worker;Iontophoresis 31m/ml Dexamethasone;Moist Heat;Stair training;Functional mobility training;Therapeutic activities;Therapeutic exercise;Balance training;Neuromuscular re-education;Manual techniques;Patient/family education;Passive range of motion;Dry needling;Taping    PT Next Visit Plan  repeat NMS, core strengthening, ankle MOBS, balance strategies    PT Home Exercise Plan  DF stretch, figure 4, ankle AROM (attempt), toe yoga/towel scrunches, prone HS curl, clam, standing weight shift; SLR, bridge, TrA with LE ext, lateral shift on step, side lunges; hip ext with ball, sidelying kick to hip ext, hip flexor stretch.    Consulted and Agree with Plan of Care  Patient       Patient will benefit from skilled therapeutic intervention in order to improve the following deficits and impairments:  Abnormal gait, Decreased range of motion, Difficulty walking, Increased muscle spasms, Decreased endurance, Decreased activity tolerance, Pain, Improper body mechanics,  Impaired flexibility, Decreased balance, Decreased strength, Impaired sensation, Postural dysfunction  Visit Diagnosis: 1. Foot  drop, left   2. Muscle weakness (generalized)   3. Other abnormalities of gait and mobility   4. Acute left-sided low back pain without sciatica        Problem List Patient Active Problem List   Diagnosis Date Noted  . Hypertension   . Hyperlipidemia   . Diabetes mellitus without complication ([place])   . GERD (gastroesophageal reflux disease)   . Vitamin D deficiency   . Encounter for long-term (current) use of other medications    [name] PT, DPT, LAT, ATC  11/17/18  11:06 AM      [place], [place], [address] Phone: [phone]   Fax:  [phone]  Name: William Moran MRN: [id number] Date of Birth: 09/28/1970

## 2018-11-19 ENCOUNTER — Ambulatory Visit: Payer: PRIVATE HEALTH INSURANCE | Admitting: Physical Therapy

## 2018-11-22 ENCOUNTER — Encounter: Payer: Self-pay | Admitting: Physical Therapy

## 2018-11-22 ENCOUNTER — Ambulatory Visit: Payer: PRIVATE HEALTH INSURANCE | Attending: Neurosurgery | Admitting: Physical Therapy

## 2018-11-22 ENCOUNTER — Other Ambulatory Visit: Payer: Self-pay

## 2018-11-22 DIAGNOSIS — M21372 Foot drop, left foot: Secondary | ICD-10-CM | POA: Insufficient documentation

## 2018-11-22 DIAGNOSIS — R2689 Other abnormalities of gait and mobility: Secondary | ICD-10-CM | POA: Diagnosis present

## 2018-11-22 DIAGNOSIS — M545 Low back pain, unspecified: Secondary | ICD-10-CM

## 2018-11-22 DIAGNOSIS — M6281 Muscle weakness (generalized): Secondary | ICD-10-CM | POA: Diagnosis present

## 2018-11-22 NOTE — Therapy (Signed)
[place], [place], [address] Phone: [phone]   Fax:  [phone]  Physical Therapy Treatment  Patient Details  Name: William Moran MRN: [id number] Date of Birth: 01/27/1971 Referring Provider (PT): Erline Levine, MD   Encounter Date: 11/22/2018  PT End of Session - 11/22/18 0855    Visit Number  12    Number of Visits  25    Date for PT Re-Evaluation  12/31/18    Authorization Type  Workers Comp    Authorization - Visit Number  12    Authorization - Number of Visits  20    PT Start Time  0850    PT Stop Time  0932    PT Time Calculation (min)  42 min    Activity Tolerance  Patient tolerated treatment well    Behavior During Therapy  [facility] for tasks assessed/performed       Past Medical History:  Diagnosis Date  . Diabetes mellitus without complication ([place])   . Encounter for long-term (current) use of other medications   . GERD (gastroesophageal reflux disease)   . Hyperlipidemia   . Hypertension   . Vitamin D deficiency     Past Surgical History:  Procedure Laterality Date  . ANKLE SURGERY Right 2009  . OPEN REDUCTION INTERNAL FIXATION (ORIF) METACARPAL Left 05/25/2014   Procedure: OPEN REDUCTION INTERNAL FIXATION (ORIF) LEFT RING FINGER METACARPAL;  Surgeon: Daryll Brod, MD;  Location: [place];  Service: Orthopedics;  Laterality: Left;    There were no vitals filed for this visit.  Subjective Assessment - 11/22/18 0859    Subjective  " I got My AFO, I did have to get it modified because the bar was going along the inside but they fixed it. I have no pain today, I just still feel weak in my knees"    Currently in Pain?  No/denies    Pain Score  0-No pain         [facility] PT Assessment - 11/22/18 0001      Assessment   Medical Diagnosis  Left foot drop    Referring Provider (PT)  Erline Levine, MD    Onset Date/Surgical Date  08/20/18                    [facility] Adult PT Treatment/Exercise - 11/22/18 0001      Lumbar Exercises: Aerobic   Elliptical  L2 x 6 min elevatoin L1      Lumbar Exercises: Supine   Dead Bug  5 reps   holding 10 sec   Dead Bug Limitations  cues to keep core tight and back flat      Knee/Hip Exercises: Machines for Strengthening   Cybex Knee Extension  2 x 10 15# bil    Cybex Leg Press  2 x 15 40# bil. 1 x 15 40# con bil- ECC LLE          Balance Exercises - 11/22/18 0925      Balance Exercises: Standing   Standing Eyes Opened  Narrow base of support (BOS);2 reps;30 secs    Standing Eyes Closed  Narrow base of support (BOS);2 reps;30 secs    Other Standing Exercises  1/2 kneeling on the table 6 x 30 sec 3 on eac side 1 EO, 2 Ec           PT Short Term Goals - 11/09/18 1047      PT SHORT TERM GOAL #  1   Title  Pt will demo hip & knee strength gross 4/5    Baseline  see flowsheet    Status  Partially Met    Target Date  11/19/18      PT SHORT TERM GOAL #2   Title  DF PROM +5    Target Date  11/19/18      PT SHORT TERM GOAL #3   Title  Pt will demo SLS with bilat UE support with level pelvis    Baseline  able to obtain position but unable to hold due to fatigue    Status  Partially Met    Target Date  11/19/18        PT Long Term Goals - 10/05/18 1801      PT LONG TERM GOAL #1   Title  Pt will ambulate without trendelenburg gait    Baseline  significant at eval- see flowsheet    Time  12    Period  Weeks    Status  New    Target Date  12/31/18      PT LONG TERM GOAL #2   Title  Gross hip/knee strength to 5/5    Baseline  see flowsheet    Time  12    Period  Weeks    Status  New    Target Date  12/31/18      PT LONG TERM GOAL #3   Title  Pt will demo ability to squat and return to stand while lifting objects    Baseline  lacking balance and strength necessary at eval    Time  12    Period  Weeks    Status  New    Target Date  12/31/18      PT LONG  TERM GOAL #4   Title  pt will be able to push/pull weighted sled for return to work duties    Baseline  lacking necessary strength/ROM/Balance at eval    Time  12    Period  Weeks    Status  New    Target Date  12/31/18            Plan - 11/22/18 0934    Clinical Impression Statement  William Moran arrives with his custom AFO, he reports no pain inthe hip or knee today and exhibits improvement with gait but conitnues to rely on knee hyper extension for stability. Focused session on hip/ core strengthening and balance training utilizing the AFO.He did well with exercises noting increased fatigue with knee extensions.    PT Treatment/Interventions  ADLs/Self Care Home Management;Cryotherapy;Advice worker;Iontophoresis 50m/ml Dexamethasone;Moist Heat;Stair training;Functional mobility training;Therapeutic activities;Therapeutic exercise;Balance training;Neuromuscular re-education;Manual techniques;Patient/family education;Passive range of motion;Dry needling;Taping    PT Next Visit Plan  repeat NMS, core strengthening, ankle MOBS, balance strategies, gait training with AFO reducing knee extension/ trunk flexion during gait    PT Home Exercise Plan  DF stretch, figure 4, ankle AROM (attempt), toe yoga/towel scrunches, prone HS curl, clam, standing weight shift; SLR, bridge, TrA with LE ext, lateral shift on step, side lunges; hip ext with ball, sidelying kick to hip ext, hip flexor stretch.    Consulted and Agree with Plan of Care  Patient       Patient will benefit from skilled therapeutic intervention in order to improve the following deficits and impairments:     Visit Diagnosis: 1. Foot drop, left   2. Muscle weakness (generalized)   3. Other abnormalities of gait and mobility  4. Acute left-sided low back pain without sciatica        Problem List Patient Active Problem List   Diagnosis Date Noted  . Hypertension   . Hyperlipidemia   . Diabetes  mellitus without complication ([place])   . GERD (gastroesophageal reflux disease)   . Vitamin D deficiency   . Encounter for long-term (current) use of other medications    [name] PT, DPT, LAT, ATC  11/22/18  9:37 AM      [place], [place], [address] Phone: [phone]   Fax:  [phone]  Name: William Moran MRN: [id number] Date of Birth: 1970-09-05

## 2018-11-24 ENCOUNTER — Other Ambulatory Visit: Payer: Self-pay

## 2018-11-24 ENCOUNTER — Ambulatory Visit: Payer: PRIVATE HEALTH INSURANCE | Admitting: Physical Therapy

## 2018-11-24 ENCOUNTER — Encounter: Payer: Self-pay | Admitting: Physical Therapy

## 2018-11-24 DIAGNOSIS — M21372 Foot drop, left foot: Secondary | ICD-10-CM | POA: Diagnosis not present

## 2018-11-24 DIAGNOSIS — R2689 Other abnormalities of gait and mobility: Secondary | ICD-10-CM

## 2018-11-24 DIAGNOSIS — M6281 Muscle weakness (generalized): Secondary | ICD-10-CM

## 2018-11-24 MED FILL — GABAPENTIN 300 MG CAPSULE: 300 | 30 days supply | Qty: 90 | Fill #1

## 2018-11-24 NOTE — Therapy (Signed)
[place], [place], [address] Phone: [phone]   Fax:  [phone]  Physical Therapy Treatment  Patient Details  Name: William Moran MRN: [id number] Date of Birth: 09-20-1970 Referring Provider (PT): Erline Levine, MD   Encounter Date: 11/24/2018  PT End of Session - 11/24/18 0854    Visit Number  13    Number of Visits  25    Date for PT Re-Evaluation  12/31/18    Authorization Type  Workers Comp    Authorization - Visit Number  13    Authorization - Number of Visits  20    PT Start Time  0850    PT Stop Time  0930    PT Time Calculation (min)  40 min    Activity Tolerance  Patient tolerated treatment well    Behavior During Therapy  [facility] for tasks assessed/performed       Past Medical History:  Diagnosis Date  . Diabetes mellitus without complication ([place])   . Encounter for long-term (current) use of other medications   . GERD (gastroesophageal reflux disease)   . Hyperlipidemia   . Hypertension   . Vitamin D deficiency     Past Surgical History:  Procedure Laterality Date  . ANKLE SURGERY Right 2009  . OPEN REDUCTION INTERNAL FIXATION (ORIF) METACARPAL Left 05/25/2014   Procedure: OPEN REDUCTION INTERNAL FIXATION (ORIF) LEFT RING FINGER METACARPAL;  Surgeon: Daryll Brod, MD;  Location: [place];  Service: Orthopedics;  Laterality: Left;    There were no vitals filed for this visit.  Subjective Assessment - 11/24/18 0853    Subjective  AFO feels better on my hip and knee. Still getting significant tightness with numbness that increases with sitting more than 30 min.    Currently in Pain?  No/denies                       [facility] Adult PT Treatment/Exercise - 11/24/18 0001      Lumbar Exercises: Aerobic   Tread Mill  retro stepping .5 5 min- cues to avoid full knee ext      Lumbar Exercises: Supine   Bridge Limitations  legs over green physioball       Knee/Hip Exercises: Standing   Lateral Step Up Limitations  4" step in // bars    Rocker Board Limitations  lateral static & dynamic    SLS  3 step: Rt toe prop, weight shift, SLS with hold    Other Standing Knee Exercises  hamstring curl ball squeeze (ball tied to knee with yellow tband)    Other Standing Knee Exercises  isometric HS curl + hip extension      Knee/Hip Exercises: Supine   Other Supine Knee/Hip Exercises  LLE physioball roll in- keeping neutral alignment             PT Education - 11/24/18 0903    Education Details  night positioning with wedge- setup on table    Person(s) Educated  Patient    Methods  Explanation;Demonstration    Comprehension  Verbalized understanding;Need further instruction       PT Short Term Goals - 11/09/18 1047      PT SHORT TERM GOAL #1   Title  Pt will demo hip & knee strength gross 4/5    Baseline  see flowsheet    Status  Partially Met    Target Date  11/19/18      PT  SHORT TERM GOAL #2   Title  DF PROM +5    Target Date  11/19/18      PT SHORT TERM GOAL #3   Title  Pt will demo SLS with bilat UE support with level pelvis    Baseline  able to obtain position but unable to hold due to fatigue    Status  Partially Met    Target Date  11/19/18        PT Long Term Goals - 10/05/18 1801      PT LONG TERM GOAL #1   Title  Pt will ambulate without trendelenburg gait    Baseline  significant at eval- see flowsheet    Time  12    Period  Weeks    Status  New    Target Date  12/31/18      PT LONG TERM GOAL #2   Title  Gross hip/knee strength to 5/5    Baseline  see flowsheet    Time  12    Period  Weeks    Status  New    Target Date  12/31/18      PT LONG TERM GOAL #3   Title  Pt will demo ability to squat and return to stand while lifting objects    Baseline  lacking balance and strength necessary at eval    Time  12    Period  Weeks    Status  New    Target Date  12/31/18      PT LONG TERM GOAL #4   Title   pt will be able to push/pull weighted sled for return to work duties    Baseline  lacking necessary strength/ROM/Balance at eval    Time  12    Period  Weeks    Status  New    Target Date  12/31/18            Plan - 11/24/18 1029    Clinical Impression Statement  good tolerance to exercise with frequent cues for posture. Mirror utilized for visual cues which was a good assistance.    PT Treatment/Interventions  ADLs/Self Care Home Management;Cryotherapy;Advice worker;Iontophoresis 21m/ml Dexamethasone;Moist Heat;Stair training;Functional mobility training;Therapeutic activities;Therapeutic exercise;Balance training;Neuromuscular re-education;Manual techniques;Patient/family education;Passive range of motion;Dry needling;Taping    PT Next Visit Plan  proximal strengthening    PT Home Exercise Plan  DF stretch, figure 4, ankle AROM (attempt), toe yoga/towel scrunches, prone HS curl, clam, standing weight shift; SLR, bridge, TrA with LE ext, lateral shift on step, side lunges; hip ext with ball, sidelying kick to hip ext, hip flexor stretch.    Consulted and Agree with Plan of Care  Patient       Patient will benefit from skilled therapeutic intervention in order to improve the following deficits and impairments:  Abnormal gait, Decreased range of motion, Difficulty walking, Increased muscle spasms, Decreased endurance, Decreased activity tolerance, Pain, Improper body mechanics, Impaired flexibility, Decreased balance, Decreased strength, Impaired sensation, Postural dysfunction  Visit Diagnosis: 1. Foot drop, left   2. Muscle weakness (generalized)   3. Other abnormalities of gait and mobility        Problem List Patient Active Problem List   Diagnosis Date Noted  . Hypertension   . Hyperlipidemia   . Diabetes mellitus without complication ([place]   . GERD (gastroesophageal reflux disease)   . Vitamin D deficiency   . Encounter for long-term (current) use  of other medications    Syed Zukas C. Lakiya Cottam PT,  DPT 11/24/18 10:34 AM   [place] [facility] [address] [place], [place], [address] Phone: [phone]   Fax:  [phone]  Name: William Moran MRN: [id number] Date of Birth: 06-07-1970

## 2018-11-29 ENCOUNTER — Ambulatory Visit: Payer: PRIVATE HEALTH INSURANCE | Admitting: Physical Therapy

## 2018-11-29 ENCOUNTER — Other Ambulatory Visit: Payer: Self-pay

## 2018-11-29 ENCOUNTER — Encounter: Payer: Self-pay | Admitting: Physical Therapy

## 2018-11-29 DIAGNOSIS — M6281 Muscle weakness (generalized): Secondary | ICD-10-CM

## 2018-11-29 DIAGNOSIS — M545 Low back pain, unspecified: Secondary | ICD-10-CM

## 2018-11-29 DIAGNOSIS — R2689 Other abnormalities of gait and mobility: Secondary | ICD-10-CM

## 2018-11-29 DIAGNOSIS — M21372 Foot drop, left foot: Secondary | ICD-10-CM | POA: Diagnosis not present

## 2018-11-29 NOTE — Therapy (Signed)
[place], [place], [address] Phone: [phone]   Fax:  [phone]  Physical Therapy Treatment  Patient Details  Name: William Moran MRN: [id number] Date of Birth: 1971-02-09 Referring Provider (PT): Erline Levine, MD   Encounter Date: 11/29/2018  PT End of Session - 11/29/18 1505    Visit Number  14    Number of Visits  25    Date for PT Re-Evaluation  12/31/18    Authorization Type  Workers Comp    Authorization - Visit Number  14    Authorization - Number of Visits  20    PT Start Time  1505    PT Stop Time  1544    PT Time Calculation (min)  39 min    Activity Tolerance  Patient tolerated treatment well    Behavior During Therapy  [facility] for tasks assessed/performed       Past Medical History:  Diagnosis Date  . Diabetes mellitus without complication ([place])   . Encounter for long-term (current) use of other medications   . GERD (gastroesophageal reflux disease)   . Hyperlipidemia   . Hypertension   . Vitamin D deficiency     Past Surgical History:  Procedure Laterality Date  . ANKLE SURGERY Right 2009  . OPEN REDUCTION INTERNAL FIXATION (ORIF) METACARPAL Left 05/25/2014   Procedure: OPEN REDUCTION INTERNAL FIXATION (ORIF) LEFT RING FINGER METACARPAL;  Surgeon: Daryll Brod, MD;  Location: [facility];  Service: Orthopedics;  Laterality: Left;    There were no vitals filed for this visit.  Subjective Assessment - 11/29/18 1505    Subjective  "I am a little sore in the hip/ knee but doing better I think"    Currently in Pain?  No/denies                       [facility] Adult PT Treatment/Exercise - 11/29/18 0001      Lumbar Exercises: Aerobic   Elliptical  L4 x 5 min elevation L4      Knee/Hip Exercises: Machines for Strengthening   Cybex Leg Press  2 x 12 80# bil LE   emphasis on controlled eccentrics     Knee/Hip Exercises: Standing   Knee Flexion   Strengthening;Both;2 sets;10 reps   alternating cues to avoid letting foot slap and control down   Lateral Step Up Limitations  6" step in // 2 x 20    Forward Step Up  2 sets;20 reps;Step Height: 8"   in //   Gait Training  heel strike/ toe off 6 x 30 ft, gradually increasing speed ea rep.     Other Standing Knee Exercises  forward stepping over 5 hurdles 8 x stepping both feet in ea. 8 x stepping in 1 foot in ea. lateral stepping 8 x     Other Standing Knee Exercises  dead lift 1 x 10 with 15#, 1 x 10 25 # touching down onto 8 in step               PT Short Term Goals - 11/09/18 1047      PT SHORT TERM GOAL #1   Title  Pt will demo hip & knee strength gross 4/5    Baseline  see flowsheet    Status  Partially Met    Target Date  11/19/18      PT SHORT TERM GOAL #2   Title  DF PROM +5  Target Date  11/19/18      PT SHORT TERM GOAL #3   Title  Pt will demo SLS with bilat UE support with level pelvis    Baseline  able to obtain position but unable to hold due to fatigue    Status  Partially Met    Target Date  11/19/18        PT Long Term Goals - 10/05/18 1801      PT LONG TERM GOAL #1   Title  Pt will ambulate without trendelenburg gait    Baseline  significant at eval- see flowsheet    Time  12    Period  Weeks    Status  New    Target Date  12/31/18      PT LONG TERM GOAL #2   Title  Gross hip/knee strength to 5/5    Baseline  see flowsheet    Time  12    Period  Weeks    Status  New    Target Date  12/31/18      PT LONG TERM GOAL #3   Title  Pt will demo ability to squat and return to stand while lifting objects    Baseline  lacking balance and strength necessary at eval    Time  12    Period  Weeks    Status  New    Target Date  12/31/18      PT LONG TERM GOAL #4   Title  pt will be able to push/pull weighted sled for return to work duties    Baseline  lacking necessary strength/ROM/Balance at eval    Time  12    Period  Weeks    Status  New     Target Date  12/31/18            Plan - 11/29/18 1544    Clinical Impression Statement  continued working on bil LE strengthening and gait training over hurdles using mirror for feedback PRN. He is able to correct gait with cues, no report of pain during or end of session.       Patient will benefit from skilled therapeutic intervention in order to improve the following deficits and impairments:  Abnormal gait, Decreased range of motion, Difficulty walking, Increased muscle spasms, Decreased endurance, Decreased activity tolerance, Pain, Improper body mechanics, Impaired flexibility, Decreased balance, Decreased strength, Impaired sensation, Postural dysfunction  Visit Diagnosis: 1. Foot drop, left   2. Muscle weakness (generalized)   3. Other abnormalities of gait and mobility   4. Acute left-sided low back pain without sciatica        Problem List Patient Active Problem List   Diagnosis Date Noted  . Hypertension   . Hyperlipidemia   . Diabetes mellitus without complication ([place])   . GERD (gastroesophageal reflux disease)   . Vitamin D deficiency   . Encounter for long-term (current) use of other medications    [name] PT, DPT, LAT, ATC  11/29/18  3:47 PM      [place], [place], [address] Phone: [phone]   Fax:  [phone]  Name: William Moran MRN: [id number] Date of Birth: 02-May-1970

## 2018-12-01 ENCOUNTER — Other Ambulatory Visit: Payer: Self-pay

## 2018-12-01 ENCOUNTER — Ambulatory Visit: Payer: PRIVATE HEALTH INSURANCE | Attending: Neurosurgery | Admitting: Physical Therapy

## 2018-12-01 ENCOUNTER — Encounter: Payer: Self-pay | Admitting: Physical Therapy

## 2018-12-01 DIAGNOSIS — R2689 Other abnormalities of gait and mobility: Secondary | ICD-10-CM | POA: Insufficient documentation

## 2018-12-01 DIAGNOSIS — M545 Low back pain: Secondary | ICD-10-CM | POA: Insufficient documentation

## 2018-12-01 DIAGNOSIS — M6281 Muscle weakness (generalized): Secondary | ICD-10-CM | POA: Insufficient documentation

## 2018-12-01 DIAGNOSIS — M21372 Foot drop, left foot: Secondary | ICD-10-CM | POA: Diagnosis not present

## 2018-12-01 NOTE — Therapy (Signed)
[place], [place], [address] Phone: [phone]   Fax:  [phone]  Physical Therapy Treatment  Patient Details  Name: William Moran MRN: [id number] Date of Birth: [birth year] Referring Provider (PT): William Levine, MD   Encounter Date: 12/01/2018  PT End of Session - 12/01/18 0927    Visit Number  15    Number of Visits  25    Date for PT Re-Evaluation  12/31/18    Authorization Type  Workers Comp    Authorization - Visit Number  15    Authorization - Number of Visits  20    PT Start Time  0849    PT Stop Time  0928    PT Time Calculation (min)  39 min    Activity Tolerance  Patient tolerated treatment well    Behavior During Therapy  William Moran for tasks assessed/performed       Past Medical History:  Diagnosis Date  . Diabetes mellitus without complication ([place])   . Encounter for long-term (current) use of other medications   . GERD (gastroesophageal reflux disease)   . Hyperlipidemia   . Hypertension   . Vitamin D deficiency     Past Surgical History:  Procedure Laterality Date  . ANKLE SURGERY Right 2009  . OPEN REDUCTION INTERNAL FIXATION (ORIF) METACARPAL Left 05/25/2014   Procedure: OPEN REDUCTION INTERNAL FIXATION (ORIF) LEFT RING FINGER METACARPAL;  Surgeon: William Brod, MD;  Location: [facility];  Service: Orthopedics;  Laterality: Left;    There were no vitals filed for this visit.  Subjective Assessment - 12/01/18 0852    Subjective  Just the discomfort from the numbness, no pain. Feels like he is ready to progress HEP.    Currently in Pain?  No/denies                       [facility] Adult PT Treatment/Exercise - 12/01/18 0001      Knee/Hip Exercises: Stretches   Piriformis Stretch  Left;30 seconds      Knee/Hip Exercises: Aerobic   Nustep  6 min L7 LE only      Knee/Hip Exercises: Standing   Forward Step Up Limitations  fwd step onto bosu with  press back+SLR    Lunge Walking - Round Trips  lunges: starting from BOSU & lowering toward BOSU, Lt foot FWD; mini lunge 3s holds; side lunge onto bosu    Other Standing Knee Exercises  balance in // bars: NBOS, tandem, toward SLS with Rt toe prop      Knee/Hip Exercises: Seated   Other Seated Knee/Hip Exercises  high kneel on bosu               PT Short Term Goals - 11/09/18 1047      PT SHORT TERM GOAL #1   Title  Pt will demo hip & knee strength gross 4/5    Baseline  see flowsheet    Status  Partially Met    Target Date  11/19/18      PT SHORT TERM GOAL #2   Title  DF PROM +5    Target Date  11/19/18      PT SHORT TERM GOAL #3   Title  Pt will demo SLS with bilat UE support with level pelvis    Baseline  able to obtain position but unable to hold due to fatigue    Status  Partially Met    Target Date  11/19/18        PT Long Term Goals - 10/05/18 1801      PT LONG TERM GOAL #1   Title  Pt will ambulate without trendelenburg gait    Baseline  significant at eval- see flowsheet    Time  12    Period  Weeks    Status  New    Target Date  12/31/18      PT LONG TERM GOAL #2   Title  Gross hip/knee strength to 5/5    Baseline  see flowsheet    Time  12    Period  Weeks    Status  New    Target Date  12/31/18      PT LONG TERM GOAL #3   Title  Pt will demo ability to squat and return to stand while lifting objects    Baseline  lacking balance and strength necessary at eval    Time  12    Period  Weeks    Status  New    Target Date  12/31/18      PT LONG TERM GOAL #4   Title  pt will be able to push/pull weighted sled for return to work duties    Baseline  lacking necessary strength/ROM/Balance at eval    Time  12    Period  Weeks    Status  New    Target Date  12/31/18            Plan - 12/01/18 0928    Clinical Impression Statement  Added lunges to HEP for foll mechanical chain activation and strengthening. Slow movements broken down for  control with good tolerance, frequent cues required. Reports he may need to do an FCE per discussion with MD.    PT Treatment/Interventions  ADLs/Self Care Home Management;Cryotherapy;Advice worker;Iontophoresis 24m/ml Dexamethasone;Moist Heat;Stair training;Functional mobility training;Therapeutic activities;Therapeutic exercise;Balance training;Neuromuscular re-education;Manual techniques;Patient/family education;Passive range of motion;Dry needling;Taping    PT Next Visit Plan  review HEP    PT Home Exercise Plan  DF stretch, figure 4, ankle AROM (attempt), toe yoga/towel scrunches, prone HS curl, clam, standing weight shift; SLR, bridge, TrA with LE ext, lateral shift on step, side lunges; hip ext with ball, sidelying kick to hip ext, hip flexor stretch; high kneel balance, fwd & lateral lunges    Consulted and Agree with Plan of Care  Patient       Patient will benefit from skilled therapeutic intervention in order to improve the following deficits and impairments:  Abnormal gait, Decreased range of motion, Difficulty walking, Increased muscle spasms, Decreased endurance, Decreased activity tolerance, Pain, Improper body mechanics, Impaired flexibility, Decreased balance, Decreased strength, Impaired sensation, Postural dysfunction  Visit Diagnosis: 1. Foot drop, left   2. Muscle weakness (generalized)   3. Other abnormalities of gait and mobility        Problem List Patient Active Problem List   Diagnosis Date Noted  . Hypertension   . Hyperlipidemia   . Diabetes mellitus without complication ([place]   . GERD (gastroesophageal reflux disease)   . Vitamin D deficiency   . Encounter for long-term (current) use of other medications   William Alderfer C. William Moran PT, DPT 12/01/18 9:30 AM   [place] [address]: [phone]  Fax:  [phone] Name: BWendle KinaMRN:  [id number] of Birth: [birth year]

## 2018-12-02 DIAGNOSIS — E119 Type 2 diabetes mellitus without complications: Secondary | ICD-10-CM | POA: Diagnosis not present

## 2018-12-02 DIAGNOSIS — Z6826 Body mass index (BMI) 26.0-26.9, adult: Secondary | ICD-10-CM | POA: Diagnosis not present

## 2018-12-02 DIAGNOSIS — M21372 Foot drop, left foot: Secondary | ICD-10-CM | POA: Diagnosis not present

## 2018-12-02 DIAGNOSIS — M545 Low back pain: Secondary | ICD-10-CM | POA: Diagnosis not present

## 2018-12-02 DIAGNOSIS — E1165 Type 2 diabetes mellitus with hyperglycemia: Secondary | ICD-10-CM | POA: Diagnosis not present

## 2018-12-06 ENCOUNTER — Other Ambulatory Visit: Payer: Self-pay

## 2018-12-06 ENCOUNTER — Encounter: Payer: Self-pay | Admitting: Physical Therapy

## 2018-12-06 ENCOUNTER — Ambulatory Visit: Payer: PRIVATE HEALTH INSURANCE | Admitting: Physical Therapy

## 2018-12-06 DIAGNOSIS — M21372 Foot drop, left foot: Secondary | ICD-10-CM

## 2018-12-06 DIAGNOSIS — M545 Low back pain, unspecified: Secondary | ICD-10-CM

## 2018-12-06 DIAGNOSIS — R2689 Other abnormalities of gait and mobility: Secondary | ICD-10-CM

## 2018-12-06 DIAGNOSIS — M6281 Muscle weakness (generalized): Secondary | ICD-10-CM

## 2018-12-06 NOTE — Therapy (Signed)
[place], [place], [address] Phone: [phone]   Fax:  [phone]  Physical Therapy Treatment  Patient Details  Name: William Moran MRN: [id number] Date of Birth: Jun 19, 1970 Referring Provider (PT): Erline Levine, MD   Encounter Date: 12/06/2018  PT End of Session - 12/06/18 0852    Visit Number  16    Number of Visits  25    Date for PT Re-Evaluation  12/31/18    Authorization Type  Workers Comp    Authorization - Visit Number  16    Authorization - Number of Visits  20    PT Start Time  [phone]   pt arrived 7 min late   PT Stop Time  0927    PT Time Calculation (min)  35 min    Activity Tolerance  Patient tolerated treatment well    Behavior During Therapy  [facility] for tasks assessed/performed       Past Medical History:  Diagnosis Date  . Diabetes mellitus without complication ([place])   . Encounter for long-term (current) use of other medications   . GERD (gastroesophageal reflux disease)   . Hyperlipidemia   . Hypertension   . Vitamin D deficiency     Past Surgical History:  Procedure Laterality Date  . ANKLE SURGERY Right 2009  . OPEN REDUCTION INTERNAL FIXATION (ORIF) METACARPAL Left 05/25/2014   Procedure: OPEN REDUCTION INTERNAL FIXATION (ORIF) LEFT RING FINGER METACARPAL;  Surgeon: Daryll Brod, MD;  Location: [place];  Service: Orthopedics;  Laterality: Left;    There were no vitals filed for this visit.  Subjective Assessment - 12/06/18 0852    Subjective  "I am alittle more sore in my back and in my foot today, I did more walking over the weekend so that coule be waht caused it"    Currently in Pain?  Yes    Pain Score  2     Pain Location  Ankle    Pain Orientation  Left    Pain Descriptors / Indicators  Aching    Pain Type  Chronic pain    Pain Onset  More than a month ago    Pain Frequency  Intermittent    Aggravating Factors   unsure         [facility] PT  Assessment - 12/06/18 0001      Assessment   Medical Diagnosis  Left foot drop                   [facility] Adult PT Treatment/Exercise - 12/06/18 0001      Knee/Hip Exercises: Aerobic   Nustep  L8 x 6 min LE only      Knee/Hip Exercises: Machines for Strengthening   Cybex Leg Press  2 x 10 LLE only 20#, 1 x 20 40# bil LE      Knee/Hip Exercises: Standing   SLS with Vectors  1 x 15 with green theraband standing on RLE    Other Standing Knee Exercises  dead lift from 8 inch step 15# 2 x 15          Balance Exercises - 12/06/18 0928      Balance Exercises: Standing   Tandem Gait  Forward;Retro;4 reps   with intermittent UE use         PT Short Term Goals - 11/09/18 1047      PT SHORT TERM GOAL #1   Title  Pt will demo hip & knee  strength gross 4/5    Baseline  see flowsheet    Status  Partially Met    Target Date  11/19/18      PT SHORT TERM GOAL #2   Title  DF PROM +5    Target Date  11/19/18      PT SHORT TERM GOAL #3   Title  Pt will demo SLS with bilat UE support with level pelvis    Baseline  able to obtain position but unable to hold due to fatigue    Status  Partially Met    Target Date  11/19/18        PT Long Term Goals - 10/05/18 1801      PT LONG TERM GOAL #1   Title  Pt will ambulate without trendelenburg gait    Baseline  significant at eval- see flowsheet    Time  12    Period  Weeks    Status  New    Target Date  12/31/18      PT LONG TERM GOAL #2   Title  Gross hip/knee strength to 5/5    Baseline  see flowsheet    Time  12    Period  Weeks    Status  New    Target Date  12/31/18      PT LONG TERM GOAL #3   Title  Pt will demo ability to squat and return to stand while lifting objects    Baseline  lacking balance and strength necessary at eval    Time  12    Period  Weeks    Status  New    Target Date  12/31/18      PT LONG TERM GOAL #4   Title  pt will be able to push/pull weighted sled for return to work duties     Baseline  lacking necessary strength/ROM/Balance at eval    Time  12    Period  Weeks    Status  New    Target Date  12/31/18            Plan - 12/06/18 0929    Clinical Impression Statement  continued LE strengthening to promote stability with standing/ walking. worked on [employer] with forward/ retro tandem in // for safety. pt does continue to fatigue quickly with LE strengthening .    PT Treatment/Interventions  ADLs/Self Care Home Management;Cryotherapy;Advice worker;Iontophoresis 12m/ml Dexamethasone;Moist Heat;Stair training;Functional mobility training;Therapeutic activities;Therapeutic exercise;Balance training;Neuromuscular re-education;Manual techniques;Patient/family education;Passive range of motion;Dry needling;Taping    PT Next Visit Plan  review HEP, strength, balance, functional mobility    PT Home Exercise Plan  DF stretch, figure 4, ankle AROM (attempt), toe yoga/towel scrunches, prone HS curl, clam, standing weight shift; SLR, bridge, TrA with LE ext, lateral shift on step, side lunges; hip ext with ball, sidelying kick to hip ext, hip flexor stretch; high kneel balance, fwd & lateral lunges    Consulted and Agree with Plan of Care  Patient       Patient will benefit from skilled therapeutic intervention in order to improve the following deficits and impairments:     Visit Diagnosis: 1. Muscle weakness (generalized)   2. Other abnormalities of gait and mobility   3. Acute left-sided low back pain without sciatica   4. Foot drop, left        Problem List Patient Active Problem List   Diagnosis Date Noted  . Hypertension   . Hyperlipidemia   . Diabetes mellitus  without complication ([place])   . GERD (gastroesophageal reflux disease)   . Vitamin D deficiency   . Encounter for long-term (current) use of other medications    [name] PT, DPT, LAT, ATC  12/06/18  9:31 AM      [place], [place], [address] Phone: [phone]   Fax:  [phone]  Name: William Moran MRN: [id number] Date of Birth: July 22, 1970

## 2018-12-08 ENCOUNTER — Encounter: Payer: Self-pay | Admitting: Physical Therapy

## 2018-12-08 ENCOUNTER — Other Ambulatory Visit: Payer: Self-pay

## 2018-12-08 ENCOUNTER — Ambulatory Visit: Payer: PRIVATE HEALTH INSURANCE | Admitting: Physical Therapy

## 2018-12-08 DIAGNOSIS — M545 Low back pain, unspecified: Secondary | ICD-10-CM

## 2018-12-08 DIAGNOSIS — M6281 Muscle weakness (generalized): Secondary | ICD-10-CM

## 2018-12-08 DIAGNOSIS — M21372 Foot drop, left foot: Secondary | ICD-10-CM

## 2018-12-08 DIAGNOSIS — R2689 Other abnormalities of gait and mobility: Secondary | ICD-10-CM

## 2018-12-08 NOTE — Therapy (Signed)
[name], [place], [address] Phone: [phone]   Fax:  [phone]  Physical Therapy Treatment  Patient Details  Name: William Moran MRN: [id number] Date of Birth: 05-01-1970 Referring Provider (PT): Erline Levine, MD   Encounter Date: 12/08/2018  PT End of Session - 12/08/18 0850    Visit Number  17    Number of Visits  25    Date for PT Re-Evaluation  12/31/18    Authorization Type  Workers Comp    Authorization - Visit Number  17    Authorization - Number of Visits  20    PT Start Time  0849    PT Stop Time  0929    PT Time Calculation (min)  40 min    Activity Tolerance  Patient tolerated treatment well    Behavior During Therapy  [facility] for tasks assessed/performed       Past Medical History:  Diagnosis Date  . Diabetes mellitus without complication ([place])   . Encounter for long-term (current) use of other medications   . GERD (gastroesophageal reflux disease)   . Hyperlipidemia   . Hypertension   . Vitamin D deficiency     Past Surgical History:  Procedure Laterality Date  . ANKLE SURGERY Right 2009  . OPEN REDUCTION INTERNAL FIXATION (ORIF) METACARPAL Left 05/25/2014   Procedure: OPEN REDUCTION INTERNAL FIXATION (ORIF) LEFT RING FINGER METACARPAL;  Surgeon: Daryll Brod, MD;  Location: [place];  Service: Orthopedics;  Laterality: Left;    There were no vitals filed for this visit.  Subjective Assessment - 12/08/18 0852    Subjective  "I was a little sore after the last session, I am get some increased soreness along the top of my foot"    Currently in Pain?  Yes    Pain Score  1     Pain Location  Ankle    Pain Orientation  Left    Pain Descriptors / Indicators  Sore         [facility] PT Assessment - 12/08/18 0001      Assessment   Medical Diagnosis  Left foot drop    Referring Provider (PT)  Erline Levine, MD                   [facility] Adult PT  Treatment/Exercise - 12/08/18 0001      Lumbar Exercises: Stretches   Other Lumbar Stretch Exercise  seated low back stretch 2 x 30       Knee/Hip Exercises: Aerobic   Nustep  L7 x 6 min LE only      Knee/Hip Exercises: Machines for Strengthening   Cybex Knee Extension  2 x 12 10# con bil, ECC LLE    Cybex Knee Flexion  2 x 12 20# LLE only      Knee/Hip Exercises: Standing   Forward Lunges  2 sets;10 reps;Both   touching down onto bosu, in // for safety   Side Lunges  2 sets;10 reps   mini lunge in // for safety   Forward Step Up  2 sets;10 reps;Left;Step Height: 6"          Balance Exercises - 12/08/18 0925      Balance Exercises: Standing   Other Standing Exercises  cont tapes reaching with L/R with cone on conter and pt (2 floor tiles away) frequent bil UE use to promote balance/ support   was able to perofrom 2 taps in a row  with significant sway         PT Short Term Goals - 11/09/18 1047      PT SHORT TERM GOAL #1   Title  Pt will demo hip & knee strength gross 4/5    Baseline  see flowsheet    Status  Partially Met    Target Date  11/19/18      PT SHORT TERM GOAL #2   Title  DF PROM +5    Target Date  11/19/18      PT SHORT TERM GOAL #3   Title  Pt will demo SLS with bilat UE support with level pelvis    Baseline  able to obtain position but unable to hold due to fatigue    Status  Partially Met    Target Date  11/19/18        PT Long Term Goals - 10/05/18 1801      PT LONG TERM GOAL #1   Title  Pt will ambulate without trendelenburg gait    Baseline  significant at eval- see flowsheet    Time  12    Period  Weeks    Status  New    Target Date  12/31/18      PT LONG TERM GOAL #2   Title  Gross hip/knee strength to 5/5    Baseline  see flowsheet    Time  12    Period  Weeks    Status  New    Target Date  12/31/18      PT LONG TERM GOAL #3   Title  Pt will demo ability to squat and return to stand while lifting objects    Baseline   lacking balance and strength necessary at eval    Time  12    Period  Weeks    Status  New    Target Date  12/31/18      PT LONG TERM GOAL #4   Title  pt will be able to push/pull weighted sled for return to work duties    Baseline  lacking necessary strength/ROM/Balance at eval    Time  12    Period  Weeks    Status  New    Target Date  12/31/18            Plan - 12/08/18 0916    Clinical Impression Statement  pt reported increased swelling/ soreness along the top of the foot. continued working on hip/ knee strengthening and endurance training which he does fatigue quickly. continued balance training with reaching outside of BOS but exhibits significant postural sway.    PT Treatment/Interventions  ADLs/Self Care Home Management;Cryotherapy;Advice worker;Iontophoresis 3m/ml Dexamethasone;Moist Heat;Stair training;Functional mobility training;Therapeutic activities;Therapeutic exercise;Balance training;Neuromuscular re-education;Manual techniques;Patient/family education;Passive range of motion;Dry needling;Taping    PT Next Visit Plan  review HEP, strength, balance, functional mobility/ endurance training.    Consulted and Agree with Plan of Care  Patient       Patient will benefit from skilled therapeutic intervention in order to improve the following deficits and impairments:     Visit Diagnosis: 1. Muscle weakness (generalized)   2. Other abnormalities of gait and mobility   3. Acute left-sided low back pain without sciatica   4. Foot drop, left        Problem List Patient Active Problem List   Diagnosis Date Noted  . Hypertension   . Hyperlipidemia   . Diabetes [place] ([place]   . GERD (gastroesophageal reflux  disease)   . Vitamin D deficiency   . Encounter for long-term (current) use of other medications     [name] PT, DPT, LAT, ATC  12/08/18  9:29 AM      [name], [place], [address] Phone: [phone]   Fax:  [phone]  Name: William Moran MRN: [id number] Date of Birth: [birth year]

## 2018-12-13 ENCOUNTER — Other Ambulatory Visit: Payer: Self-pay

## 2018-12-13 ENCOUNTER — Ambulatory Visit: Payer: PRIVATE HEALTH INSURANCE | Admitting: Physical Therapy

## 2018-12-13 ENCOUNTER — Encounter: Payer: Self-pay | Admitting: Physical Therapy

## 2018-12-13 DIAGNOSIS — R2689 Other abnormalities of gait and mobility: Secondary | ICD-10-CM

## 2018-12-13 DIAGNOSIS — M545 Low back pain, unspecified: Secondary | ICD-10-CM

## 2018-12-13 DIAGNOSIS — M21372 Foot drop, left foot: Secondary | ICD-10-CM | POA: Diagnosis not present

## 2018-12-13 DIAGNOSIS — M6281 Muscle weakness (generalized): Secondary | ICD-10-CM

## 2018-12-13 NOTE — Therapy (Signed)
[place], [place], [address] Phone: [phone]   Fax:  [phone]  Physical Therapy Treatment  Patient Details  Name: Xabi Wittler MRN: [id number] Date of Birth: 06-Apr-1971 Referring Provider (PT): Erline Levine, MD   Encounter Date: 12/13/2018  PT End of Session - 12/13/18 0936    Visit Number  18    Number of Visits  25    Date for PT Re-Evaluation  12/31/18    Authorization - Visit Number  18    PT Start Time  0846    PT Stop Time  0930    PT Time Calculation (min)  44 min    Activity Tolerance  Patient tolerated treatment well    Behavior During Therapy  [facility] for tasks assessed/performed       Past Medical History:  Diagnosis Date  . Diabetes mellitus without complication ([place])   . Encounter for long-term (current) use of other medications   . GERD (gastroesophageal reflux disease)   . Hyperlipidemia   . Hypertension   . Vitamin D deficiency     Past Surgical History:  Procedure Laterality Date  . ANKLE SURGERY Right 2009  . OPEN REDUCTION INTERNAL FIXATION (ORIF) METACARPAL Left 05/25/2014   Procedure: OPEN REDUCTION INTERNAL FIXATION (ORIF) LEFT RING FINGER METACARPAL;  Surgeon: Daryll Brod, MD;  Location: [place];  Service: Orthopedics;  Laterality: Left;    There were no vitals filed for this visit.  Subjective Assessment - 12/13/18 0851    Subjective  " I am doing pretty good with the leg and my knees, I do have some sorness in the R side of my low back which I think is from the way I slept"    Currently in Pain?  Yes    Pain Location  Back    Pain Orientation  Right    Aggravating Factors   unsure    Pain Relieving Factors  N/A                       [facility] Adult PT Treatment/Exercise - 12/13/18 0001      Knee/Hip Exercises: Aerobic   Nustep  L7 x  8 min LE only      Knee/Hip Exercises: Machines for Strengthening   Hip Cybex  hip bip  abduction/ flexion 2 x ea bil going to fatigue with 12#      Knee/Hip Exercises: Standing   Step Down  2 sets;15 reps;Step Height: 4"    Step Down Limitations  attempted 6 inch step but dropped back to 4 inch due to     Other Standing Knee Exercises  dead lift from 8 inch step 15# 2 x 15      Manual Therapy   Manual therapy comments  MTPR along the R glute medius x 3   educated how to use tennis ball at home              PT Short Term Goals - 11/09/18 1047      PT SHORT TERM GOAL #1   Title  Pt will demo hip & knee strength gross 4/5    Baseline  see flowsheet    Status  Partially Met    Target Date  11/19/18      PT SHORT TERM GOAL #2   Title  DF PROM +5    Target Date  11/19/18      PT SHORT TERM GOAL #3  Title  Pt will demo SLS with bilat UE support with level pelvis    Baseline  able to obtain position but unable to hold due to fatigue    Status  Partially Met    Target Date  11/19/18        PT Long Term Goals - 10/05/18 1801      PT LONG TERM GOAL #1   Title  Pt will ambulate without trendelenburg gait    Baseline  significant at eval- see flowsheet    Time  12    Period  Weeks    Status  New    Target Date  12/31/18      PT LONG TERM GOAL #2   Title  Gross hip/knee strength to 5/5    Baseline  see flowsheet    Time  12    Period  Weeks    Status  New    Target Date  12/31/18      PT LONG TERM GOAL #3   Title  Pt will demo ability to squat and return to stand while lifting objects    Baseline  lacking balance and strength necessary at eval    Time  12    Period  Weeks    Status  New    Target Date  12/31/18      PT LONG TERM GOAL #4   Title  pt will be able to push/pull weighted sled for return to work duties    Baseline  lacking necessary strength/ROM/Balance at eval    Time  12    Period  Weeks    Status  New    Target Date  12/31/18            Plan - 12/13/18 0936    Clinical Impression Statement  pt reported increaesd  sorness in the R glute med that released with MTPR. Cotninued focus on strengthening with emphasis placed on endurance training which he is showing improvement with.    PT Treatment/Interventions  ADLs/Self Care Home Management;Cryotherapy;Advice worker;Iontophoresis 80m/ml Dexamethasone;Moist Heat;Stair training;Functional mobility training;Therapeutic activities;Therapeutic exercise;Balance training;Neuromuscular re-education;Manual techniques;Patient/family education;Passive range of motion;Dry needling;Taping    PT Next Visit Plan  review HEP, strength, balance, functional mobility/ endurance training.    PT Home Exercise Plan  DF stretch, figure 4, ankle AROM (attempt), toe yoga/towel scrunches, prone HS curl, clam, standing weight shift; SLR, bridge, TrA with LE ext, lateral shift on step, side lunges; hip ext with ball, sidelying kick to hip ext, hip flexor stretch; high kneel balance, fwd & lateral lunges    Consulted and Agree with Plan of Care  Patient       Patient will benefit from skilled therapeutic intervention in order to improve the following deficits and impairments:  Abnormal gait, Decreased range of motion, Difficulty walking, Increased muscle spasms, Decreased endurance, Decreased activity tolerance, Pain, Improper body mechanics, Impaired flexibility, Decreased balance, Decreased strength, Impaired sensation, Postural dysfunction  Visit Diagnosis: Muscle weakness (generalized)  Other abnormalities of gait and mobility  Acute left-sided low back pain without sciatica  Foot drop, left     Problem List Patient Active Problem List   Diagnosis Date Noted  . Hypertension   . Hyperlipidemia   . Diabetes mellitus without complication ([place]   . GERD (gastroesophageal reflux disease)   . Vitamin D deficiency   . Encounter for long-term (current) use of other medications    Kristoffer Leamon PT, DPT, LAT, ATC  12/13/18  9:41 AM  [place], [place], [address] Phone: [phone]   Fax:  [phone]  Name: Gaige Sebo MRN: [id number] Date of Birth: [birth year]

## 2018-12-16 ENCOUNTER — Other Ambulatory Visit: Payer: Self-pay

## 2018-12-16 ENCOUNTER — Ambulatory Visit: Payer: PRIVATE HEALTH INSURANCE | Admitting: Physical Therapy

## 2018-12-16 DIAGNOSIS — M545 Low back pain, unspecified: Secondary | ICD-10-CM

## 2018-12-16 DIAGNOSIS — R2689 Other abnormalities of gait and mobility: Secondary | ICD-10-CM

## 2018-12-16 DIAGNOSIS — M6281 Muscle weakness (generalized): Secondary | ICD-10-CM

## 2018-12-16 DIAGNOSIS — M21372 Foot drop, left foot: Secondary | ICD-10-CM

## 2018-12-16 NOTE — Therapy (Signed)
[place], [place], [address] Phone: [phone]   Fax:  [phone]  Physical Therapy Treatment  Patient Details  Name: William Moran MRN: [id number] Date of Birth: June 22, 1970 Referring Provider (PT): Erline Levine, MD   Encounter Date: 12/16/2018  PT End of Session - 12/16/18 0853    Visit Number  19    Number of Visits  25    Date for PT Re-Evaluation  12/31/18    Authorization - Visit Number  12    Authorization - Number of Visits  20    PT Start Time  0849    PT Stop Time  0929    PT Time Calculation (min)  40 min    Activity Tolerance  Patient tolerated treatment well    Behavior During Therapy  [facility] for tasks assessed/performed       Past Medical History:  Diagnosis Date  . Diabetes mellitus without complication ([place])   . Encounter for long-term (current) use of other medications   . GERD (gastroesophageal reflux disease)   . Hyperlipidemia   . Hypertension   . Vitamin D deficiency     Past Surgical History:  Procedure Laterality Date  . ANKLE SURGERY Right 2009  . OPEN REDUCTION INTERNAL FIXATION (ORIF) METACARPAL Left 05/25/2014   Procedure: OPEN REDUCTION INTERNAL FIXATION (ORIF) LEFT RING FINGER METACARPAL;  Surgeon: Daryll Brod, MD;  Location: [place];  Service: Orthopedics;  Laterality: Left;    There were no vitals filed for this visit.  Subjective Assessment - 12/16/18 0911    Subjective  " pt arrived noting significant soreness in the R glute med rated at 6/10 today with no specific onset"    Currently in Pain?  Yes    Pain Score  6     Pain Location  Hip    Pain Orientation  Right         [facility] PT Assessment - 12/16/18 0001      Assessment   Medical Diagnosis  Left foot drop    Referring Provider (PT)  Erline Levine, MD                   [facility] Adult PT Treatment/Exercise - 12/16/18 0001      Knee/Hip Exercises: Standing   Other  Standing Knee Exercises  TRX squat 1 x 10, lunge 1 x 10 bil       Knee/Hip Exercises: Supine   Other Supine Knee/Hip Exercises  adductor ball squeeze 2 x 10 holding 10 sec ea.      Manual Therapy   Manual therapy comments  skilled palpation and monitoring of pt throughout TPDN    Soft tissue mobilization  IASTM along R glute med       Trigger Point Dry Needling - 12/16/18 0001    Consent Given?  Yes    Education Handout Provided  Yes    Muscles Treated Back/Hip  Gluteus medius    Gluteus Medius Response  Twitch response elicited;Palpable increased muscle length   R only          PT Education - 12/16/18 0934    Education Details  updated HEP. educated on TPDN/    Person(s) Educated  Patient    Methods  Explanation;Verbal cues    Comprehension  Verbalized understanding;Verbal cues required       PT Short Term Goals - 11/09/18 1047      PT SHORT TERM GOAL #1   Title  Pt will demo hip & knee strength gross 4/5    Baseline  see flowsheet    Status  Partially Met    Target Date  11/19/18      PT SHORT TERM GOAL #2   Title  DF PROM +5    Target Date  11/19/18      PT SHORT TERM GOAL #3   Title  Pt will demo SLS with bilat UE support with level pelvis    Baseline  able to obtain position but unable to hold due to fatigue    Status  Partially Met    Target Date  11/19/18        PT Long Term Goals - 10/05/18 1801      PT LONG TERM GOAL #1   Title  Pt will ambulate without trendelenburg gait    Baseline  significant at eval- see flowsheet    Time  12    Period  Weeks    Status  New    Target Date  12/31/18      PT LONG TERM GOAL #2   Title  Gross hip/knee strength to 5/5    Baseline  see flowsheet    Time  12    Period  Weeks    Status  New    Target Date  12/31/18      PT LONG TERM GOAL #3   Title  Pt will demo ability to squat and return to stand while lifting objects    Baseline  lacking balance and strength necessary at eval    Time  12    Period   Weeks    Status  New    Target Date  12/31/18      PT LONG TERM GOAL #4   Title  pt will be able to push/pull weighted sled for return to work duties    Baseline  lacking necessary strength/ROM/Balance at eval    Time  12    Period  Weeks    Status  New    Target Date  12/31/18            Plan - 12/16/18 0932    Clinical Impression Statement  pt noted increased soreness int he R glute med. educated and consent was provided for TPDN of the R glute med followed with IASTM techniques. worked on adductor activation to promote stability of the hip/SIJ. continued working on hip strengtheing with TRX which he did but fatigued quickly. end of session he reported pain dropped to 2/10.    PT Next Visit Plan  review HEP, strength, balance, functional mobility/ endurance training. update HEP for TRX training. discussed continued strengthening vs discharge.    PT Home Exercise Plan  DF stretch, figure 4, ankle AROM (attempt), toe yoga/towel scrunches, prone HS curl, clam, standing weight shift; SLR, bridge, TrA with LE ext, lateral shift on step, side lunges; hip ext with ball, sidelying kick to hip ext, hip flexor stretch; high kneel balance, fwd & lateral lunges. adductor ball squeeze       Patient will benefit from skilled therapeutic intervention in order to improve the following deficits and impairments:     Visit Diagnosis: Muscle weakness (generalized)  Other abnormalities of gait and mobility  Acute left-sided low back pain without sciatica  Foot drop, left     Problem List Patient Active Problem List   Diagnosis Date Noted  . Hypertension   . Hyperlipidemia   . Diabetes mellitus without complication ([place])   .  GERD (gastroesophageal reflux disease)   . Vitamin D deficiency   . Encounter for long-term (current) use of other medications    [name] PT, DPT, LAT, ATC  12/16/18  9:34 AM      [place], [place], [address] Phone: [phone]   Fax:  [phone]  Name: William Moran MRN: [id number] Date of Birth: 1970/07/05

## 2018-12-20 MED FILL — GABAPENTIN 300 MG CAPSULE: 300 | 30 days supply | Qty: 90 | Fill #0

## 2018-12-21 ENCOUNTER — Ambulatory Visit: Payer: PRIVATE HEALTH INSURANCE | Admitting: Physical Therapy

## 2018-12-21 DIAGNOSIS — G5763 Lesion of plantar nerve, bilateral lower limbs: Secondary | ICD-10-CM | POA: Diagnosis not present

## 2018-12-21 DIAGNOSIS — M19072 Primary osteoarthritis, left ankle and foot: Secondary | ICD-10-CM | POA: Diagnosis not present

## 2018-12-21 DIAGNOSIS — E119 Type 2 diabetes mellitus without complications: Secondary | ICD-10-CM | POA: Diagnosis not present

## 2018-12-21 DIAGNOSIS — G5791 Unspecified mononeuropathy of right lower limb: Secondary | ICD-10-CM | POA: Diagnosis not present

## 2018-12-21 DIAGNOSIS — Z6826 Body mass index (BMI) 26.0-26.9, adult: Secondary | ICD-10-CM | POA: Diagnosis not present

## 2018-12-21 DIAGNOSIS — M19071 Primary osteoarthritis, right ankle and foot: Secondary | ICD-10-CM | POA: Diagnosis not present

## 2018-12-21 DIAGNOSIS — M21372 Foot drop, left foot: Secondary | ICD-10-CM | POA: Diagnosis not present

## 2018-12-21 DIAGNOSIS — M545 Low back pain: Secondary | ICD-10-CM | POA: Diagnosis not present

## 2018-12-22 ENCOUNTER — Other Ambulatory Visit: Payer: Self-pay

## 2018-12-22 ENCOUNTER — Ambulatory Visit: Payer: PRIVATE HEALTH INSURANCE | Attending: Neurosurgery | Admitting: Physical Therapy

## 2018-12-22 ENCOUNTER — Encounter: Payer: Self-pay | Admitting: Physical Therapy

## 2018-12-22 DIAGNOSIS — R2689 Other abnormalities of gait and mobility: Secondary | ICD-10-CM | POA: Insufficient documentation

## 2018-12-22 DIAGNOSIS — M6281 Muscle weakness (generalized): Secondary | ICD-10-CM | POA: Insufficient documentation

## 2018-12-22 DIAGNOSIS — M545 Low back pain, unspecified: Secondary | ICD-10-CM

## 2018-12-22 DIAGNOSIS — M21372 Foot drop, left foot: Secondary | ICD-10-CM | POA: Insufficient documentation

## 2018-12-22 NOTE — Therapy (Signed)
William Moran, [place], [address] Phone: [phone]   Fax:  [phone]  Physical Therapy Treatment/Discharge  Patient Details  Name: William Moran MRN: [id number] Date of Birth: February 03, 1971 Referring Provider (PT): Erline Levine, MD   Encounter Date: 12/22/2018  PT End of Session - 12/22/18 1443    Visit Number  20    Number of Visits  25    Date for PT Re-Evaluation  12/31/18    Authorization Type  Workers Comp    Authorization - Visit Number  20    Authorization - Number of Visits  20    PT Start Time  1408    PT Stop Time  1446    PT Time Calculation (min)  38 min    Activity Tolerance  Patient tolerated treatment well    Behavior During Therapy  [facility] for tasks assessed/performed       Past Medical History:  Diagnosis Date  . Diabetes mellitus without complication ([place])   . Encounter for long-term (current) use of other medications   . GERD (gastroesophageal reflux disease)   . Hyperlipidemia   . Hypertension   . Vitamin D deficiency     Past Surgical History:  Procedure Laterality Date  . ANKLE SURGERY Right 2009  . OPEN REDUCTION INTERNAL FIXATION (ORIF) METACARPAL Left 05/25/2014   Procedure: OPEN REDUCTION INTERNAL FIXATION (ORIF) LEFT RING FINGER METACARPAL;  Surgeon: Daryll Brod, MD;  Location: [facility];  Service: Orthopedics;  Laterality: Left;    There were no vitals filed for this visit.  Subjective Assessment - 12/22/18 1410    Subjective  Rt knee is hurting and feels weak i dont know why.    Currently in Pain?  Yes    Pain Score  1     Pain Location  Knee    Pain Orientation  Right    Pain Descriptors / Indicators  Discomfort    Pain Type  Acute pain    Pain Onset  In the past 7 days    Pain Frequency  Intermittent    Aggravating Factors   not sure    Pain Relieving Factors  NA         [facility] PT Assessment - 12/22/18 0001      Assessment   Medical  Diagnosis  Left foot drop    Referring Provider (PT)  Erline Levine, MD      Strength   Right Hip Flexion  4-/5    Left Hip Flexion  4/5    Right Knee Flexion  4+/5    Right Knee Extension  3+/5    Left Knee Flexion  4+/5    Left Knee Extension  4+/5    Right Ankle Dorsiflexion  4-/5    Left Ankle Dorsiflexion  1/5           [facility] Adult PT Treatment/Exercise - 12/22/18 0001      Pilates   Pilates Reformer  began with footwork , stopped to reassess Rt LE strength , was unable to coordinate , 2 Red 1 blue on heels parallel x 15 only       Lumbar Exercises: Aerobic   Nustep  8 min independentl post session       Lumbar Exercises: Sidelying   Other Sidelying Lumbar Exercises  side plank x 3 x 15 sec       Knee/Hip Exercises: Supine   Bridges  Strengthening;Both;1 set;10 reps    Straight Leg  Raises  Strengthening;Both;1 set;10 reps    Straight Leg Raises Limitations  unable to do 4 lbs on Rt side     Other Supine Knee/Hip Exercises  clam blue band x 10       Knee/Hip Exercises: Sidelying   Clams  x 20 blue band              PT Education - 12/22/18 1451    Education Details  HEP, urgency of seeing MD due to new weakness    Person(s) Educated  Patient    Methods  Explanation    Comprehension  Verbalized understanding       PT Short Term Goals - 12/22/18 1659      PT SHORT TERM GOAL #1   Title  Pt will demo hip & knee strength gross 4/5    Status  Partially Met      PT SHORT TERM GOAL #2   Title  DF PROM +5      PT SHORT TERM GOAL #3   Title  Pt will demo SLS with bilat UE support with level pelvis        PT Long Term Goals - 12/22/18 1659      PT LONG TERM GOAL #1   Title  Pt will ambulate without trendelenburg gait    Status  On-going      PT LONG TERM GOAL #2   Title  Gross hip/knee strength to 5/5    Status  On-going      PT LONG TERM GOAL #3   Title  Pt will demo ability to squat and return to stand while lifting objects    Status  On-going       PT LONG TERM GOAL #4   Title  pt will be able to push/pull weighted sled for return to work duties      PT [place] #5   Title  pt will be independent in long term stretching and exercise program for further protection of back.     Status  Achieved       LTG update 12/23/2018: LTG #1 status- significantly improved with addition of AFO but is does still present with trendelenburg on Lt LTG #2 status- see objective measures LTG #3 status- was able to demo with light objects & kettle bells prior to this visit, no complaints of Rt LE symptoms LTG #4 status- unable to assess, not appropriate to test with onset of RLE symptoms Jessica C. Hightower PT, DPT 12/23/18 3:25 PM       Plan - 12/22/18 1449    Clinical Impression Statement  Patient with new onset of Rt LE weakness.  Denies sensory changes, bowel and bladder.  He shows dimished relfexes on Rt side.  This was his last WC visit approved.  Recommend he finish POC today and follow up wth MD ASAP.    Examination-Activity Limitations  Sleep;Squat;Carry;Stairs;Stand;Dressing;Lift;Transfers;Locomotion Level    PT Treatment/Interventions  ADLs/Self Care Home Management;Cryotherapy;Advice worker;Iontophoresis 101m/ml Dexamethasone;Moist Heat;Stair training;Functional mobility training;Therapeutic activities;Therapeutic exercise;Balance training;Neuromuscular re-education;Manual techniques;Patient/family education;Passive range of motion;Dry needling;Taping    PT Next Visit Plan  NA, DC from PT    PT Home Exercise Plan  DF stretch, figure 4, ankle AROM (attempt), toe yoga/towel scrunches, prone HS curl, clam, standing weight shift; SLR, bridge, TrA with LE ext, lateral shift on step, side lunges; hip ext with ball, sidelying kick to hip ext, hip flexor stretch; high kneel balance, fwd & lateral lunges. adductor ball squeeze  Consulted and Agree with Plan of Care  Patient       Patient will benefit from skilled  therapeutic intervention in order to improve the following deficits and impairments:  Abnormal gait, Decreased range of motion, Difficulty walking, Increased muscle spasms, Decreased endurance, Decreased activity tolerance, Pain, Improper body mechanics, Impaired flexibility, Decreased balance, Decreased strength, Impaired sensation, Postural dysfunction  Visit Diagnosis: Muscle weakness (generalized)  Other abnormalities of gait and mobility  Acute left-sided low back pain without sciatica  Foot drop, left     Problem List Patient Active Problem List   Diagnosis Date Noted  . Hypertension   . Hyperlipidemia   . Diabetes mellitus without complication ([place])   . GERD (gastroesophageal reflux disease)   . Vitamin D deficiency   . Encounter for long-term (current) use of other medications     Syan Cullimore 12/22/2018, 5:03 PM  [place], [place], [address] Phone: [phone]   Fax:  [phone]  Name: William Moran MRN: [id number] Date of Birth: Nov 19, 1970  Raeford Razor, PT 12/22/18 5:03 PM Phone: [phone] Fax: [phone]

## 2018-12-23 ENCOUNTER — Other Ambulatory Visit: Payer: Self-pay | Admitting: Neurosurgery

## 2018-12-23 DIAGNOSIS — M21372 Foot drop, left foot: Secondary | ICD-10-CM

## 2018-12-29 MED FILL — HYDROCODON-APAP 5-325: 5-325 | 7 days supply | Qty: 28 | Fill #0

## 2019-01-01 ENCOUNTER — Encounter (HOSPITAL_COMMUNITY): Payer: Self-pay

## 2019-01-01 ENCOUNTER — Ambulatory Visit (HOSPITAL_COMMUNITY)
Admission: RE | Admit: 2019-01-01 | Discharge: 2019-01-01 | Disposition: A | Discharge status: Home / Self Care | Payer: PRIVATE HEALTH INSURANCE | Source: Ambulatory Visit | Attending: Neurosurgery | Admitting: Neurosurgery

## 2019-01-01 NOTE — Progress Notes (Signed)
Pt needs up to date labs due to prior medical conditions before contrast can be given. Pt aware. To be rescheduled.

## 2019-01-05 ENCOUNTER — Other Ambulatory Visit: Payer: Self-pay

## 2019-01-05 ENCOUNTER — Ambulatory Visit (HOSPITAL_COMMUNITY)
Admission: RE | Admit: 2019-01-05 | Discharge: 2019-01-05 | Disposition: A | Discharge status: Home / Self Care | Payer: PRIVATE HEALTH INSURANCE | Source: Ambulatory Visit | Attending: Neurosurgery | Admitting: Neurosurgery

## 2019-01-05 DIAGNOSIS — M21372 Foot drop, left foot: Secondary | ICD-10-CM

## 2019-01-05 MED ORDER — GADOBUTROL 1 MMOL/ML IV SOLN
8.0000 mL | Freq: Once | INTRAVENOUS | Status: AC | PRN
  Administered 2019-01-05: 8 mL via INTRAVENOUS

## 2019-01-10 ENCOUNTER — Telehealth: Payer: Self-pay | Admitting: Neurology

## 2019-01-10 NOTE — Telephone Encounter (Signed)
This is an urgent referral from Dr. Vertell Limber. I would like to see him on Thursday for emg/ncs. I would like to move around some patients to do that,  Will discuss with you both. But first, Lexine Baton can you see if he can even come Thursday? thanks

## 2019-01-10 NOTE — Telephone Encounter (Signed)
I tried reaching out to patient, but he didn't answer. Was able to leave a message on his VM.

## 2019-01-10 NOTE — Telephone Encounter (Signed)
Note pt has been scheduled to see Dr. Allayne Gitelman 9/24.

## 2019-01-11 ENCOUNTER — Ambulatory Visit (INDEPENDENT_AMBULATORY_CARE_PROVIDER_SITE_OTHER): Payer: 59 | Admitting: Orthopaedic Surgery

## 2019-01-11 ENCOUNTER — Ambulatory Visit: Payer: Self-pay

## 2019-01-11 ENCOUNTER — Other Ambulatory Visit: Payer: Self-pay

## 2019-01-11 DIAGNOSIS — M25561 Pain in right knee: Secondary | ICD-10-CM

## 2019-01-11 DIAGNOSIS — M7061 Trochanteric bursitis, right hip: Secondary | ICD-10-CM

## 2019-01-11 NOTE — Progress Notes (Signed)
Office Visit Note   Patient: William Moran           Date of Birth: February 26, 1971           MRN: [id number] Visit Date: 01/11/2019              Requested by: Georgianne Fick, MD [address] [place],  [place] [address] PCP: Georgianne Fick, MD   Assessment & Plan: Visit Diagnoses:  1. Trochanteric bursitis of right hip   2. Acute pain of right knee     Plan: Impression is right hip and knee pain.  X-rays do not show any structural abnormalities.  I do not get the sense that there is any intra-articular abnormalities.  I think it is possible that his discomfort is coming from either muscular weakness due to deconditioning or some sort of neuropathic condition.  I agree that the next step should be to get an evaluation by neurologist.  In the meantime I have given him a prescription for outpatient PT to work on the hip and knee strengthening.  Questions encouraged and answered.  Follow-up as needed.  Follow-Up Instructions: Return if symptoms worsen or fail to improve.   Orders:  Orders Placed This Encounter  Procedures  . XR HIP UNILAT W OR W/O PELVIS 1V RIGHT  . XR KNEE 3 VIEW RIGHT   No orders of the defined types were placed in this encounter.     Procedures: No procedures performed   Clinical Data: No additional findings.   Subjective: Chief Complaint  Patient presents with  . Right Hip - Pain  . 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  He does have a chronic left foot drop from earlier this year and unfortunately decompressive back surgery is not been able to resolve this thus far.  He does endorse some radiation of pain into his groin from the lateral aspect of the right hip.  He denies any red flag symptoms   Review of Systems  Constitutional: Negative.   All other systems  reviewed and are negative.    Objective: Vital Signs: There were no vitals taken for this visit.  Physical Exam Vitals signs and nursing note reviewed.  Constitutional:      Appearance: He is well-developed.  HENT:     Head: Normocephalic and atraumatic.  Eyes:     Pupils: Pupils are equal, round, and reactive to light.  Neck:     Musculoskeletal: Neck supple.  Pulmonary:     Effort: Pulmonary effort is normal.  Abdominal:     Palpations: Abdomen is soft.  Musculoskeletal: Normal range of motion.  Skin:    General: Skin is warm.  Neurological:     Mental Status: He is alert and oriented to person, place, and time.  Psychiatric:        Behavior: Behavior normal.        Thought Content: Thought content normal.        Judgment: Judgment normal.     Ortho Exam Right hip exam shows no tenderness to palpation.  There is no swelling.  The muscles appear to be slightly atrophied.  I am not able to elicit any pain with internal and external rotation or with deep flexion.  Negative sciatic tension signs.   Right knee exam  shows no joint effusion.  He has normal range of motion without pain.  Collaterals and cruciates are stable.  No patellofemoral crepitus.  No joint line tenderness. Specialty Comments:  No specialty comments available.  Imaging: No results found.   PMFS History: Patient Active Problem List   Diagnosis Date Noted  . Acute pain of right knee 01/11/2019  . Trochanteric bursitis of right hip 01/11/2019  . Hypertension   . Hyperlipidemia   . Diabetes mellitus without complication ([place])   . GERD (gastroesophageal reflux disease)   . Vitamin D deficiency   . Encounter for long-term (current) use of other medications    Past Medical History:  Diagnosis Date  . Diabetes mellitus without complication ([place])   . Encounter for long-term (current) use of other medications   . GERD (gastroesophageal reflux disease)   . Hyperlipidemia   . Hypertension   . Vitamin  D deficiency     Family History  Problem Relation Age of Onset  . Diabetes Mother     Past Surgical History:  Procedure Laterality Date  . ANKLE SURGERY Right 2009  . OPEN REDUCTION INTERNAL FIXATION (ORIF) METACARPAL Left 05/25/2014   Procedure: OPEN REDUCTION INTERNAL FIXATION (ORIF) LEFT RING FINGER METACARPAL;  Surgeon: Daryll Brod, MD;  Location: [facility];  Service: Orthopedics;  Laterality: Left;   Social History   Occupational History  . Not on file  Tobacco Use  . Smoking status: Never Smoker  . Smokeless tobacco: Never Used  Substance and Sexual Activity  . Alcohol use: Yes  . Drug use: No  . Sexual activity: Not on file

## 2019-01-11 NOTE — Telephone Encounter (Signed)
He will be getting NCS prior to my appoitment at 915 as discussed. thanks

## 2019-01-13 ENCOUNTER — Ambulatory Visit (INDEPENDENT_AMBULATORY_CARE_PROVIDER_SITE_OTHER): Payer: 59 | Admitting: Neurology

## 2019-01-13 ENCOUNTER — Other Ambulatory Visit: Payer: Self-pay

## 2019-01-13 ENCOUNTER — Encounter: Payer: Self-pay | Admitting: Neurology

## 2019-01-13 ENCOUNTER — Ambulatory Visit: Payer: 59 | Admitting: Neurology

## 2019-01-13 VITALS — BP 160/86 | HR 92 | Ht 68.5 in | Wt 178.0 lb

## 2019-01-13 DIAGNOSIS — E0844 Diabetes mellitus due to underlying condition with diabetic amyotrophy: Secondary | ICD-10-CM

## 2019-01-13 DIAGNOSIS — E1344 Other specified diabetes mellitus with diabetic amyotrophy: Secondary | ICD-10-CM

## 2019-01-13 DIAGNOSIS — Z0289 Encounter for other administrative examinations: Secondary | ICD-10-CM

## 2019-01-13 NOTE — Patient Instructions (Addendum)

## 2019-01-13 NOTE — Progress Notes (Signed)
[facility]    Provider:  Dr Lucia GaskinsAhern Requesting Provider: Dr. Maeola HarmanJoseph Stern Primary Care Provider:  Georgianne Fickamachandran, Ajith, MD  CC:  Leg pain  HPI:  William Moran is a 48 y.o. male here as requested by Georgianne Fickamachandran, Ajith, MD for diabetic mononeuropathy associated with diabetes.  Past medical history hypertension, hyperlipidemia, uncontrolled diabetes. Patient saw Dr. Venetia MaxonStern for a foot drop on the left, he was having significant back pain and left leg pain, had surgery and improved, he went to physical therapy and the left leg became stronger. The right hip started hurting 3-4 weeks ago, he went back for MRI lumbar spine and no etiology found. The right hip pain then became right quad pain and knee pain. He noticed right leg hip/ thigh weakness as well with intact distal strength. He went to ortho but he did not think it was orthopaedic but possibly bursitis, need to rule out neurogenic causes. He can't sleep on the right side. Ortho thinks he may have bursitis because he sleeps on the right side and was also compensating for the left leg pain. A shot in the right for bursitis helped a little bit. But here to rule out any neurogenic causes. Diabetes is getting better. He states his diabetes management is better, started on insulin 3-4 weeks ago, his diabetes was becoming more controlled a month before his right leg started hurting, last hgba1c was 10.2 but has been managing diabetes much better since July. Not severe in the right thigh as far as pain, worse with standing a lot it becomes more painful, not painful all the time, not worsening but it getting better.   Reviewed notes, labs and imaging from outside physicians, which showed:  This is a patient referred by Dr. Maeola HarmanJoseph Stern, he presented to Dr. Venetia MaxonStern with left foot drop status post lumbar surgery.  After that he developed a new complaint of right leg weakness and saw Dr. Venetia MaxonStern for follow-up January 03, 2019.  He has  significant right quad weakness and says that his right leg has been buckling on him.  He also has dorsiflexion and EHL weakness on the right 4-5.  He has an absent knee jerk on the right.  He is wearing his AFO on the left and says this is helped him with his foot drop.  Dr. Venetia MaxonStern was concerned with a diabetic mononeuropathy as an explanation for the right leg weakness.  He ordered an urgent MRI to make sure there is no significant disc herniation in his lumbar spine.  MRI of the lumbar spine was significant only for L4-L5 minimal broad-based disc bulge which causes mild bilateral neural foraminal stenosis, L5-S1 enhancing granulation tissue which appears to contact and cause mild impingement upon the left L5 descending nerve root and causes effacement of the left posterior thecal sac, there remains a broad-based disc bulge with a central disc protrusion, there is also annular fissure seen posteriorly, moderate right and mild left neural foraminal narrowing, mild effacement of the anterior thecal sac.  However this did not show any right-sided pathology in his lumbar spine to account for the sudden quadriceps weakness.  He is quite disabled by the weakness in his right leg.  Likely a diabetic mononeuropathy.  I reviewed Dr. Fredrich BirksStern's notes and also the MRI of the lumbar spine neuroradiology report.  Hemoglobin A1c in 2015 was 10.1, I do not have any recent lab work.  Personally reviewed imaging MRI lumbar spine 12/2018 and agree with the following:  IMPRESSION: Status  post hemilaminotomy at L5-S1. There is new enhancing granulation tissue which contacts and causes impingement upon the left L5 descending nerve root and mild effacement of the posterior left thecal sac. There remains is the central disc protrusion with a broad-based disc bulge and moderate left neural foraminal narrowing.   Review of Systems: Patient complains of symptoms per HPI as well as the following symptoms: leg weakness. Pertinent  negatives and positives per HPI. All others negative.   Social History   Socioeconomic History  . Marital status: Single    Spouse name: Not on file  . Number of children: 0  . Years of education: Not on file  . Highest education level: Some college, no degree  Occupational History  . Not on file  Social Needs  . Financial resource strain: Not on file  . Food insecurity    Worry: Not on file    Inability: Not on file  . Transportation needs    Medical: Not on file    Non-medical: Not on file  Tobacco Use  . Smoking status: Never Smoker  . Smokeless tobacco: Never Used  Substance and Sexual Activity  . Alcohol use: Not Currently    Alcohol/week: 3.0 - 4.0 standard drinks    Types: 3 - 4 Cans of beer per week  . Drug use: No  . Sexual activity: Not on file  Lifestyle  . Physical activity    Days per week: Not on file    Minutes per session: Not on file  . Stress: Not on file  Relationships  . Social Herbalist on phone: Not on file    Gets together: Not on file    Attends religious service: Not on file    Active member of club or organization: Not on file    Attends meetings of clubs or organizations: Not on file    Relationship status: Not on file  . Intimate partner violence    Fear of current or ex partner: Not on file    Emotionally abused: Not on file    Physically abused: Not on file    Forced sexual activity: Not on file  Other Topics Concern  . Not on file  Social History Narrative   His mother lives with him   Left handed    Family History  Problem Relation Age of Onset  . Diabetes Mother   . High blood pressure Mother     Past Medical History:  Diagnosis Date  . Diabetes mellitus without complication ([facility])    on Metformin and insulin  . Encounter for long-term (current) use of other medications   . GERD (gastroesophageal reflux disease)    pt states this is monitored and no medication has been prescribed   . Hyperlipidemia    pt  states this is monitored and no medication has been prescribed   . Hypertension    pt states this is monitored and no medication has been prescribed   . Vitamin D deficiency     Patient Active Problem List   Diagnosis Date Noted  . 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 Acute pain of right knee 01/11/2019  . Trochanteric bursitis of right hip 01/11/2019  . Hypertension   . Hyperlipidemia   . Diabetes mellitus without complication ([place])   . GERD (gastroesophageal reflux disease)   . Vitamin D deficiency   . Encounter for long-term (current) use of other medications  Past Surgical History:  Procedure Laterality Date  . ANKLE SURGERY Right 2009  . LUMBAR LAMINECTOMY & DISCECTOMY  08/27/2018  . OPEN REDUCTION INTERNAL FIXATION (ORIF) METACARPAL Left 05/25/2014   Procedure: OPEN REDUCTION INTERNAL FIXATION (ORIF) LEFT RING FINGER METACARPAL;  Surgeon: Cindee Salt, MD;  Location: [facility];  Service: Orthopedics;  Laterality: Left;    Current Outpatient Medications  Medication Sig Dispense Refill  . metFORMIN (GLUCOPHAGE) 500 MG tablet Take 500 mg by mouth 4 (four) times daily. Pt takes 2 in the morning and 2 in the evening    . PRESCRIPTION MEDICATION Short acting insulin 6 units into the skin three times daily with meals.  Long acting insulin 20 units into the skin every night.     No current facility-administered medications for this visit.     Allergies as of 01/13/2019  . (No Known Allergies)    Vitals: BP (!) 160/86 (BP Location: Left Arm, Patient Position: Sitting)   Pulse 92   Ht 5' 8.5" (1.74 m)   Wt 178 lb (80.7 kg) Comment: reported by pt, weighed 01/11/2019  BMI 26.67 kg/m  Last Weight:  Wt Readings from Last 1 Encounters:  01/13/19 178 lb (80.7 kg)   Last Height:   Ht Readings from Last 1 Encounters:  01/13/19 5' 8.5" (1.74 m)     Physical exam: Exam: Gen: NAD, conversant                     CV: RRR, no MRG. No Carotid Bruits. No peripheral edema, warm, nontender Eyes: Conjunctivae clear without exudates or hemorrhage  Neuro: Detailed Neurologic Exam  Speech:    Speech is normal; fluent and spontaneous with normal comprehension.  Cognition:    The patient is oriented to person, place, and time;     recent and remote memory intact;     language fluent;     normal attention, concentration,     fund of knowledge Cranial Nerves:    The pupils are equal, round, and reactive to light. The fundi are normal and spontaneous venous pulsations are present. Visual fields are full to finger confrontation. Extraocular movements are intact. Trigeminal sensation is intact and the muscles of mastication are normal. The face is symmetric. The palate elevates in the midline. Hearing intact. Voice is normal. Shoulder shrug is normal. The tongue has normal motion without fasciculations.   Coordination:    Normal finger to nose and heel to shin. Normal rapid alternating movements.   Gait:    Steppage gait on rigjt and sightly antalgic  Motor Observation:    Atrophy right thigh Tone:    Normal muscle tone.    Posture:    Posture is normal. normal erect    Strength: left foott dorsiflexion 0/5. Otherwise left leg 5/5.     Right leg: 3+/5 hip flexion and leg extension, 5-/5 right foot dorsiflexion.     Sensation: loss of sensation dorsum of the left foot     Reflex Exam:  DTR's:   Left AJ and right patellar absent. Right AJ trace.  Toes:    The toes are downgoing bilaterally.   Clonus:    Clonus is absent.    Assessment/Plan:  48 y.o. male here as requested by Georgianne Fick, MD for diabetic mononeuropathy associated with diabetes.  Past medical history hypertension, hyperlipidemia, uncontrolled diabetes. 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  This is a severe disorder that can cause significant pain and functional  mobility or  other limitations.  Usually it starts proximally with severe pain followed by atrophy and weakness in one limb, not uncommon for it to move to the other limb.  There is no treatment except conservative treatment.  Fortunately it is self-limited and patient appears to be improving, he has been going to physical therapy, he does not report significant pain at this time, and he says he is getting stronger.  We will perform an EMG nerve conduction study and monitor him clinically.  Discussed this with patient, provided literature for him to read on the condition, he has been recently trying to manage his diabetes (from an uncontrolled state) and he has been doing better on his glucose control which is consistent with this disorder in many cases.  Orders Placed This Encounter  Procedures  . NCV with EMG(electromyography)    Cc: Georgianne Fick, MD, Maeola Harman MD  Naomie Dean, MD  [facility] [address] [place], [place] [address]  Phone [phone] Fax [phone]

## 2019-01-15 ENCOUNTER — Encounter: Payer: Self-pay | Admitting: Neurology

## 2019-01-15 DIAGNOSIS — E0844 Diabetes mellitus due to underlying condition with diabetic amyotrophy: Secondary | ICD-10-CM | POA: Insufficient documentation

## 2019-01-15 DIAGNOSIS — E1140 Type 2 diabetes mellitus with diabetic neuropathy, unspecified: Secondary | ICD-10-CM | POA: Insufficient documentation

## 2019-01-15 NOTE — Progress Notes (Signed)
See procedure note.

## 2019-01-15 NOTE — Progress Notes (Signed)
Full Name: William Moran Gender: Male MRN #: [id number] Date of Birth: 01-29-1971    Visit Date: 01/13/2019 09:41 Age: 48 Years 30 Months Old Examining Physician: Sarina Ill, MD  Referring Physician: Erline Levine, MD  History: Patient with uncontrolled diabetes and proximal lower extremity weakness and pain. He has recently started to better control his diabetes.   Summary: The left peroneal motor nerve showed no response.  The right peroneal motor nerve showed reduced amplitude (1.9 mV, N>2) and decreased conduction velocity (fib head to ankle, 58m/s, N>44) and decreased conduction velocity (pop fossa to fib head, 31 m/s, N>44).  The left tibial motor nerve showed reduced amplitude (0.7 mV, N>4) and decreased conduction velocity (pop fossa to ankle, 30 m/s,N> 41).  The right tibial motor nerve showed reduced amplitude (1.4 mV, N>4) and decreased conduction velocity (pop fossa to ankle, 30 m/s,N> 41).  The left sural sensory nerve, right sural sensory nerve, left superficial peroneal sensory nerve, right superficial peroneal nerve all showed no response.  The left tibial F wave showed delayed latency (70.7 ms, N<56).  The right tibial F wave showed delayed latency (67.3 ms,N< 56).  All nerves and muscles (as indicated in the following tables) were within normal limits.  The right iliopsoas and right vastus medialis muscles showed increased spontaneous activity and diminished recruitment.  The right tibialis anterior, right medial gastrocnemius, left tibialis anterior and the  right extensor hallucis longus muscles showed increased spontaneous activity.  The left extensor hallucis longus muscle showed increased spontaneous activity, polyphasic motor units and diminished recruitment.  Could not perform EMG needle examination on the lower lumbar paraspinals as they are unreliable after lumbar surgery.All remaining  muscles (as indicated in the following tables) were within normal limits.        Conclusion: 1.  Proximal muscles of the right leg showed acute/ongoing denervation and chronic neurogenic changes.  These findings, along with clinical presentation, are consistent with diabetic amyotrophy. 2.  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   Cc:  Erline Levine, MD, Merrilee Seashore MD  Sarina Ill, M.D.  [facility] [place], [place] [address] Tel: [phone] Fax: [phone]        [facility]    Nerve / Sites Muscle Latency Ref. Amplitude Ref. Rel Amp Segments Distance Velocity Ref. Area    ms ms mV mV %  cm m/s m/s mVms  L Ulnar - ADM     Wrist ADM 3.3 ?3.3 8.7 ?6.0 100 Wrist - ADM 7   26.8     B.Elbow ADM 7.6  8.2  93.9 B.Elbow - Wrist 21 49 ?49 25.8     A.Elbow ADM 9.6  7.9  97.4 A.Elbow - B.Elbow 10 49 ?49 25.8         A.Elbow - Wrist      L Peroneal - EDB     Ankle EDB NR ?6.5 NR ?2.0 NR Ankle - EDB 9   NR     Fib head EDB NR  NR  NR Fib head - Ankle 30 NR ?44 NR  R Peroneal - EDB     Ankle EDB 6.3 ?6.5 1.9 ?2.0 100 Ankle - EDB 9   8.7     Fib head EDB 15.6  1.1  59.3 Fib head - Ankle 30 32 ?44 6.2     Pop fossa EDB 18.8  1.1  92.3 Pop fossa - Fib head 10 31 ?44 5.9  Pop fossa - Ankle      L Tibial - AH     Ankle AH 5.3 ?5.8 0.7 ?4.0 100 Ankle - AH 9   3.4     Pop fossa AH 17.9  0.5  75.3 Pop fossa - Ankle 38 30 ?41 2.3  R Tibial - AH     Ankle AH 5.8 ?5.8 1.4 ?4.0 100 Ankle - AH 9   7.0     Pop fossa AH 18.5  0.6  43.8 Pop fossa - Ankle 38 30 ?41 3.5               SNC    Nerve / Sites Rec. Site Peak Lat Ref.  Amp Ref. Segments Distance    ms ms V V  cm  L Sural - Ankle (Calf)     Calf Ankle NR ?4.4 NR ?6 Calf - Ankle 14  R Sural - Ankle (Calf)     Calf Ankle NR ?4.4 NR ?6 Calf - Ankle 14  L Superficial peroneal - Ankle     Lat leg Ankle NR ?4.4 NR ?6 Lat leg - Ankle 14  R Superficial peroneal - Ankle     Lat leg Ankle NR ?4.4 NR ?6 Lat leg - Ankle 14  L Ulnar -  Orthodromic, (Dig V, Mid palm)     Dig V Wrist 2.9 ?3.1 6 ?5 Dig V - Wrist 41               F  Wave    Nerve F Lat Ref.   ms ms  L Tibial - AH 70.7 ?56.0  R Tibial - AH 67.3 ?56.0  L Ulnar - ADM 31.3 ?32.0           EMG full       EMG Summary Table    Spontaneous MUAP Recruitment  Muscle IA Fib PSW Fasc Other Amp Dur. Poly Pattern  R. Iliopsoas Normal 3+ 3+ None _______ Normal Normal Normal Reduced  R. Vastus medialis Normal 3+ 3+ None _______ Normal Normal Normal Reduced  R. Tibialis anterior Normal None 3+ None _______ Normal Normal Normal Normal  R. Gastrocnemius (Medial head) Normal None 2+ None _______ Normal Normal Normal Normal  R. Biceps femoris (long head) Normal None None None _______ Normal Normal Normal Normal  R. Gluteus maximus Normal None None None _______ Normal Normal Normal Normal  R. Gluteus medius Normal None None None _______ Normal Normal Normal Normal  L. Extensor hallucis longus Normal None 3+ None _______ Normal Normal 2+ Reduced  L. Iliopsoas Normal None None None _______ Normal Normal Normal Normal  L. Vastus medialis Normal None None None _______ Normal Normal Normal Normal  L. Tibialis anterior Normal None 3+ None _______ Normal Normal Normal Normal  L. Gastrocnemius (Medial head) Normal None None None _______ Normal Normal Normal Normal  R. Extensor hallucis longus Normal None 3+ None _______ Normal Normal Normal Normal  L. Biceps femoris (long head) Normal None None None _______ Normal Normal Normal Normal

## 2019-01-18 NOTE — Procedures (Signed)
Full Name: William Moran Gender: Male MRN #: [id number] Date of Birth: 08-25-1970    Visit Date: 01/13/2019 09:41 Age: 48 Years 6 Months Old Examining Physician: Naomie Dean, MD  Referring Physician: Maeola Harman, MD  History: Patient with uncontrolled diabetes and proximal lower extremity weakness and pain. He has recently started to better control his diabetes.   Summary: The left peroneal motor nerve showed no response.  The right peroneal motor nerve showed reduced amplitude (1.9 mV, N>2) and decreased conduction velocity (fib head to ankle, 49m/s, N>44) and decreased conduction velocity (pop fossa to fib head, 31 m/s, N>44).  The left tibial motor nerve showed reduced amplitude (0.7 mV, N>4) and decreased conduction velocity (pop fossa to ankle, 30 m/s,N> 41).  The right tibial motor nerve showed reduced amplitude (1.4 mV, N>4) and decreased conduction velocity (pop fossa to ankle, 30 m/s,N> 41).  The left sural sensory nerve, right sural sensory nerve, left superficial peroneal sensory nerve, right superficial peroneal nerve all showed no response.  The left tibial F wave showed delayed latency (70.7 ms, N<56).  The right tibial F wave showed delayed latency (67.3 ms,N< 56).  All nerves and muscles (as indicated in the following tables) were within normal limits.  The right iliopsoas and right vastus medialis muscles showed increased spontaneous activity and diminished recruitment.  The right tibialis anterior, right medial gastrocnemius, left tibialis anterior and the  right extensor hallucis longus muscles showed increased spontaneous activity.  The left extensor hallucis longus muscle showed increased spontaneous activity, polyphasic motor units and diminished recruitment.  Could not perform EMG needle examination on the lower lumbar paraspinals as they are unreliable after lumbar surgery.All remaining  muscles (as indicated in the following tables) were within normal limits.        Conclusion: 1.  Proximal muscles of the right leg showed acute/ongoing denervation and chronic neurogenic changes.  These findings, along with clinical presentation, are consistent with diabetic amyotrophy. 2.  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   Naomie Dean, M.D.  [facility] [address] [place], [place] [address] Tel: [phone] Fax: [phone]        [facility]    Nerve / Sites Muscle Latency Ref. Amplitude Ref. Rel Amp Segments Distance Velocity Ref. Area    ms ms mV mV %  cm m/s m/s mVms  L Ulnar - ADM     Wrist ADM 3.3 ?3.3 8.7 ?6.0 100 Wrist - ADM 7   26.8     B.Elbow ADM 7.6  8.2  93.9 B.Elbow - Wrist 21 49 ?49 25.8     A.Elbow ADM 9.6  7.9  97.4 A.Elbow - B.Elbow 10 49 ?49 25.8         A.Elbow - Wrist      L Peroneal - EDB     Ankle EDB NR ?6.5 NR ?2.0 NR Ankle - EDB 9   NR     Fib head EDB NR  NR  NR Fib head - Ankle 30 NR ?44 NR  R Peroneal - EDB     Ankle EDB 6.3 ?6.5 1.9 ?2.0 100 Ankle - EDB 9   8.7     Fib head EDB 15.6  1.1  59.3 Fib head - Ankle 30 32 ?44 6.2     Pop fossa EDB 18.8  1.1  92.3 Pop fossa - Fib head 10 31 ?44 5.9         Pop fossa -  Ankle      L Tibial - AH     Ankle AH 5.3 ?5.8 0.7 ?4.0 100 Ankle - AH 9   3.4     Pop fossa AH 17.9  0.5  75.3 Pop fossa - Ankle 38 30 ?41 2.3  R Tibial - AH     Ankle AH 5.8 ?5.8 1.4 ?4.0 100 Ankle - AH 9   7.0     Pop fossa AH 18.5  0.6  43.8 Pop fossa - Ankle 38 30 ?41 3.5               SNC    Nerve / Sites Rec. Site Peak Lat Ref.  Amp Ref. Segments Distance    ms ms V V  cm  L Sural - Ankle (Calf)     Calf Ankle NR ?4.4 NR ?6 Calf - Ankle 14  R Sural - Ankle (Calf)     Calf Ankle NR ?4.4 NR ?6 Calf - Ankle 14  L Superficial peroneal - Ankle     Lat leg Ankle NR ?4.4 NR ?6 Lat leg - Ankle 14  R Superficial peroneal - Ankle     Lat leg Ankle NR ?4.4 NR ?6 Lat leg - Ankle 14  L Ulnar - Orthodromic, (Dig V, Mid palm)     Dig V Wrist 2.9 ?3.1 6  ?5 Dig V - Wrist 35               F  Wave    Nerve F Lat Ref.   ms ms  L Tibial - AH 70.7 ?56.0  R Tibial - AH 67.3 ?56.0  L Ulnar - ADM 31.3 ?32.0           EMG full       EMG Summary Table    Spontaneous MUAP Recruitment  Muscle IA Fib PSW Fasc Other Amp Dur. Poly Pattern  R. Iliopsoas Normal 3+ 3+ None _______ Normal Normal Normal Reduced  R. Vastus medialis Normal 3+ 3+ None _______ Normal Normal Normal Reduced  R. Tibialis anterior Normal None 3+ None _______ Normal Normal Normal Normal  R. Gastrocnemius (Medial head) Normal None 2+ None _______ Normal Normal Normal Normal  R. Biceps femoris (long head) Normal None None None _______ Normal Normal Normal Normal  R. Gluteus maximus Normal None None None _______ Normal Normal Normal Normal  R. Gluteus medius Normal None None None _______ Normal Normal Normal Normal  L. Extensor hallucis longus Normal None 3+ None _______ Normal Normal 2+ Reduced  L. Iliopsoas Normal None None None _______ Normal Normal Normal Normal  L. Vastus medialis Normal None None None _______ Normal Normal Normal Normal  L. Tibialis anterior Normal None 3+ None _______ Normal Normal Normal Normal  L. Gastrocnemius (Medial head) Normal None None None _______ Normal Normal Normal Normal  R. Extensor hallucis longus Normal None 3+ None _______ Normal Normal Normal Normal  L. Biceps femoris (long head) Normal None None None _______ Normal Normal Normal Normal

## 2019-01-19 MED FILL — GABAPENTIN 300 MG CAPSULE: 300 | 30 days supply | Qty: 90 | Fill #1

## 2019-02-10 DIAGNOSIS — Z20828 Contact with and (suspected) exposure to other viral communicable diseases: Secondary | ICD-10-CM | POA: Diagnosis not present

## 2019-02-16 MED FILL — GABAPENTIN 300 MG CAPSULE: 300 | 30 days supply | Qty: 90 | Fill #0

## 2019-02-22 ENCOUNTER — Ambulatory Visit: Payer: PRIVATE HEALTH INSURANCE | Attending: Neurosurgery | Admitting: Physical Therapy

## 2019-02-22 ENCOUNTER — Encounter: Payer: Self-pay | Admitting: Physical Therapy

## 2019-02-22 ENCOUNTER — Other Ambulatory Visit: Payer: Self-pay

## 2019-02-22 DIAGNOSIS — R2689 Other abnormalities of gait and mobility: Secondary | ICD-10-CM | POA: Insufficient documentation

## 2019-02-22 DIAGNOSIS — M545 Low back pain, unspecified: Secondary | ICD-10-CM

## 2019-02-22 DIAGNOSIS — M21372 Foot drop, left foot: Secondary | ICD-10-CM | POA: Diagnosis present

## 2019-02-22 DIAGNOSIS — M6281 Muscle weakness (generalized): Secondary | ICD-10-CM | POA: Insufficient documentation

## 2019-02-22 NOTE — Therapy (Signed)
[facility] [address] [place], [place], [address] Phone: [phone]   Fax:  [phone]  Physical Therapy Evaluation  Patient Details  Name: William Moran MRN: [id number] Date of Birth: [birth year] Referring Provider (PT): Maeola HarmanJoseph Stern, MD   Encounter Date: 02/22/2019  PT End of Session - 02/22/19 1017    Visit Number  1    Number of Visits  16    Date for PT Re-Evaluation  04/22/19    Authorization Type  [facility]    PT Start Time  1000    PT Stop Time  1045    PT Time Calculation (min)  45 min    Activity Tolerance  Patient tolerated treatment well    Behavior During Therapy  [facility] for tasks assessed/performed       Past Medical History:  Diagnosis Date  . Diabetes mellitus without complication (HCC)    on Metformin and insulin  . Encounter for long-term (current) use of other medications   . GERD (gastroesophageal reflux disease)    pt states this is monitored and no medication has been prescribed   . Hyperlipidemia    pt states this is monitored and no medication has been prescribed   . Hypertension    pt states this is monitored and no medication has been prescribed   . Vitamin D deficiency     Past Surgical History:  Procedure Laterality Date  . ANKLE SURGERY Right 2009  . LUMBAR LAMINECTOMY & DISCECTOMY  08/27/2018  . OPEN REDUCTION INTERNAL FIXATION (ORIF) METACARPAL Left 05/25/2014   Procedure: OPEN REDUCTION INTERNAL FIXATION (ORIF) LEFT RING FINGER METACARPAL;  Surgeon: Cindee SaltGary Kuzma, MD;  Location: [facility];  Service: Orthopedics;  Laterality: Left;    There were no vitals filed for this visit.   Subjective Assessment - 02/22/19 1013    Subjective  Pt arriving to therapy evaluation c/o L foot drop and weakness. Pt wearing an AFO. Pt dx with lumbar radiculopathy s/p laminectomy on 08/27/2018.    Pertinent History  lumbar laminectomy 08/27/2018, L drop foot (AFO), DM II    Limitations  Walking     How long can you walk comfortably?  15 minutes    Patient Stated Goals  I want to improve my balance, I want to strengthen my knee, and be able to bend/squat.    Currently in Pain?  No/denies    Pain Score  2     Pain Location  Knee    Pain Orientation  Left    Pain Descriptors / Indicators  Discomfort    Pain Type  Chronic pain    Pain Onset  More than a month ago    Pain Frequency  Intermittent    Aggravating Factors   bending, walking longer distances.    Pain Relieving Factors  resting         [facility] PT Assessment - 02/22/19 0001      Assessment   Medical Diagnosis  lumbar radiculopathy, L foot drop    Referring Provider (PT)  Maeola HarmanJoseph Stern, MD    Onset Date/Surgical Date  06/17/18   surgery 08/27/2018   Hand Dominance  Right    Prior Therapy  yes, for increased L foot strenghtening and back strengthening      Precautions   Precautions  None    Required Braces or Orthoses  Other Brace/Splint    Other Brace/Splint  AFO Left      Restrictions   Weight Bearing Restrictions  No      Balance Screen   Has the patient fallen in the past 6 months  Yes    How many times?  1    Is the patient reluctant to leave their home because of a fear of falling?   No      Home Environment   Living Environment  Private residence    Living Arrangements  Parent    Type of Home  House    Home Access  Level entry      Prior Function   Level of Independence  Independent    Vocation  Full time employment    Vocation Requirements  works in [place] for [employer]   Overall Cognitive Status  Within Functional Limits for tasks assessed      Observation/Other Assessments   Focus on Therapeutic Outcomes (FOTO)    55 % limitation      Posture/Postural Control   Posture/Postural Control  Postural limitations    Postural Limitations  Rounded Shoulders;Forward head      ROM / Strength   AROM / PROM / Strength  AROM;Strength      Strength   Strength Assessment Site   Hip;Knee;Ankle    Right/Left Hip  Right;Left    Right Hip Flexion  4/5    Right Hip Extension  3+/5    Right Hip ABduction  4/5    Right Hip ADduction  4/5    Left Hip Flexion  5/5    Left Hip Extension  5/5    Left Hip ABduction  5/5    Left Hip ADduction  5/5    Right/Left Knee  Right;Left    Right Knee Flexion  4/5    Right Knee Extension  3-/5    Left Knee Flexion  5/5    Left Knee Extension  5/5    Right/Left Ankle  Right;Left    Right Ankle Dorsiflexion  4-/5    Right Ankle Plantar Flexion  4-/5    Left Ankle Dorsiflexion  1/5    Left Ankle Plantar Flexion  3/5      Palpation   Palpation comment  TTP R lumbar paraspinals, R PSIS and lateral sacral border      Transfers   Five time sit to stand comments   40.5 seconds using UE support      Ambulation/Gait   Assistive device  Small based quad cane    Gait Pattern  Step-through pattern;Decreased step length - left;Decreased stance time - left;Decreased hip/knee flexion - right;Decreased hip/knee flexion - left;Decreased dorsiflexion - left;Poor foot clearance - left;Poor foot clearance - right      Standardized Balance Assessment   Standardized Balance Assessment  Berg Balance Test      Berg Balance Test   Sit to Stand  Able to stand  independently using hands    Standing Unsupported  Able to stand safely 2 minutes    Sitting with Back Unsupported but Feet Supported on Floor or Stool  Able to sit safely and securely 2 minutes    Stand to Sit  Controls descent by using hands    Transfers  Able to transfer safely, definite need of hands    Standing Unsupported with Eyes Closed  Able to stand 10 seconds safely    Standing Unsupported with Feet Together  Needs help to attain position and unable to hold for 15 seconds    From Standing, Reach Forward with Outstretched Arm  Can reach forward >12 cm safely (5")    From Standing Position, Pick up Object from Floor  Unable to try/needs assist to keep balance    From Standing  Position, Turn to Look Behind Over each Shoulder  Turn sideways only but maintains balance    Turn 360 Degrees  Needs assistance while turning    Standing Unsupported, Alternately Place Feet on Step/Stool  Needs assistance to keep from falling or unable to try    Standing Unsupported, One Foot in Front  Loses balance while stepping or standing    Standing on One Leg  Unable to try or needs assist to prevent fall    Total Score  26                Objective measurements completed on examination: See above findings.                PT Short Term Goals - 02/22/19 1136      PT SHORT TERM GOAL #1   Title  Pt will improve his 5 time sit to stand to </= 20 seconds using UE support as needed.    Baseline  40.5 seconds (02/22/2019)    Time  4    Period  Weeks    Status  New    Target Date  03/24/19      PT SHORT TERM GOAL #2   Title  Pt will improve his bilateral hamstring flexibility to >/= 80 degrees.    Baseline  R: 65 degrees, L: 70 degrees    Time  4    Period  Weeks    Status  New    Target Date  03/24/19      PT SHORT TERM GOAL #3   Title  Pt will be able to demonstrate SLS with no pelvis drop.    Baseline  trendelenburg hip drop    Time  4    Period  Weeks    Status  New    Target Date  03/23/19        PT Long Term Goals - 02/22/19 1342      PT LONG TERM GOAL #1   Title  Pt will ambulate without trendelenburg gait pattern for >/= 500 feet.    Baseline  mild trendelenburg gait using a quad cane and L AFO    Time  8    Period  Weeks    Status  New    Target Date  04/20/19      PT LONG TERM GOAL #2   Title  Pt will improve his R LE strength to >/= 4+/5.    Baseline  see flowsheet    Time  8    Period  Weeks    Status  New      PT LONG TERM GOAL #3   Title  Pt will demo ability to squat and return to stand while lifting objects    Baseline  lacking balance and strength necessary at eval    Time  8    Period  Weeks    Status  New    Target  Date  04/14/19      PT LONG TERM GOAL #4   Title  Pt will improve his BERG balance score by >/= 10 points.    Baseline  BERG: 26/56 on 02/22/2019    Time  8    Period  Weeks    Status  New    Target Date  04/14/19  Plan - 02/22/19 1018    Clinical Impression Statement  Pt arriving to therapy with lumbar radiculopathy dx but reporting weakness due to Left foot drop. Pt with decresed balance and recent fall where he fell on his left knee with mild skin laceration. Pt reports he was wearing his AFO at the time of the fall. Pt presenting with weakness noted in L quad, and hip, and R ankle. Pt with limited df ROM bilaterally. BERG score: 26/56. Skilled PT needed to address pt's impairments with the below interventions.    Personal Factors and Comorbidities  Comorbidity 3+    Comorbidities  DM, HTN, ORIF metacarpal, hyperlipidemia, vit-D deficiency, discectomy, lumbar laminectomy, ankle surgery, diabetic amyotrophy, foot drop with AFO    Examination-Activity Limitations  Stairs;Stand;Squat    Examination-Participation Restrictions  Community Activity;Other    Stability/Clinical Decision Making  Evolving/Moderate complexity    Clinical Decision Making  High    Rehab Potential  Good    PT Frequency  2x / week    PT Duration  --   10 visits   PT Treatment/Interventions  ADLs/Self Care Home Management;Moist Heat;Ultrasound;Gait training;Stair training;Functional mobility training;Therapeutic activities;Therapeutic exercise;Balance training;Neuromuscular re-education;Patient/family education;Manual techniques;Passive range of motion;Taping;Dry needling    PT Home Exercise Plan  Access Code: [email or web address] (bridges, standing achilles stretch with towel under midfoot)    Consulted and Agree with Plan of Care  Patient       Patient will benefit from skilled therapeutic intervention in order to improve the following deficits and impairments:  Pain, Decreased strength, Decreased activity  tolerance, Decreased range of motion, Abnormal gait, Decreased balance, Decreased mobility, Difficulty walking, Impaired flexibility  Visit Diagnosis: Acute left-sided low back pain without sciatica  Foot drop, left  Muscle weakness (generalized)  Other abnormalities of gait and mobility     Problem List Patient Active Problem List   Diagnosis Date Noted  . 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 Diabetic neuropathy ([place]) 01/15/2019  . Acute pain of right knee 01/11/2019  . Trochanteric bursitis of right hip 01/11/2019  . Hypertension   . Hyperlipidemia   . Diabetes mellitus without complication ([place])   . GERD (gastroesophageal reflux disease)   . Vitamin D deficiency   . Encounter for long-term (current) use of other medications     Oretha Caprice, PT 02/22/2019, 1:46 PM  [place], [place], [address] Phone: [phone]   Fax:  [phone]  Name: Rodriques Badie MRN: [id number] Date of Birth: 01-28-1971

## 2019-02-22 NOTE — Addendum Note (Signed)
Addended by: Kearney Hard R on: 02/22/2019 01:50 PM   Modules accepted: Orders

## 2019-03-01 DIAGNOSIS — I10 Essential (primary) hypertension: Secondary | ICD-10-CM | POA: Diagnosis not present

## 2019-03-01 DIAGNOSIS — E782 Mixed hyperlipidemia: Secondary | ICD-10-CM | POA: Diagnosis not present

## 2019-03-01 DIAGNOSIS — E1165 Type 2 diabetes mellitus with hyperglycemia: Secondary | ICD-10-CM | POA: Diagnosis not present

## 2019-03-07 ENCOUNTER — Ambulatory Visit: Payer: PRIVATE HEALTH INSURANCE | Admitting: Physical Therapy

## 2019-03-09 ENCOUNTER — Ambulatory Visit: Payer: PRIVATE HEALTH INSURANCE | Admitting: Physical Therapy

## 2019-03-14 ENCOUNTER — Ambulatory Visit: Payer: PRIVATE HEALTH INSURANCE | Admitting: Physical Therapy

## 2019-03-16 ENCOUNTER — Encounter: Payer: Self-pay | Admitting: Physical Therapy

## 2019-03-16 ENCOUNTER — Ambulatory Visit: Payer: PRIVATE HEALTH INSURANCE | Admitting: Physical Therapy

## 2019-03-16 ENCOUNTER — Other Ambulatory Visit: Payer: Self-pay

## 2019-03-16 DIAGNOSIS — M6281 Muscle weakness (generalized): Secondary | ICD-10-CM

## 2019-03-16 DIAGNOSIS — M545 Low back pain, unspecified: Secondary | ICD-10-CM

## 2019-03-16 DIAGNOSIS — R2689 Other abnormalities of gait and mobility: Secondary | ICD-10-CM | POA: Diagnosis not present

## 2019-03-16 DIAGNOSIS — M21372 Foot drop, left foot: Secondary | ICD-10-CM

## 2019-03-16 MED FILL — GABAPENTIN 300 MG CAPSULE: 300 | 30 days supply | Qty: 120 | Fill #0

## 2019-03-16 NOTE — Therapy (Signed)
[place], [place], [address] Phone: [phone]   Fax:  [phone]  Physical Therapy Treatment  Patient Details  Name: William Moran MRN: [id number] Date of Birth: Dec 13, 1970 Referring Provider (PT): Erline Levine, MD   Encounter Date: 03/16/2019  PT End of Session - 03/16/19 1149    Visit Number  2    Number of Visits  16    Date for PT Re-Evaluation  04/22/19    Authorization Type  UMR    PT Start Time  1148    PT Stop Time  1226    PT Time Calculation (min)  38 min    Activity Tolerance  Patient tolerated treatment well    Behavior During Therapy  [facility] for tasks assessed/performed       Past Medical History:  Diagnosis Date  . Diabetes mellitus without complication (HCC)    on Metformin and insulin  . Encounter for long-term (current) use of other medications   . GERD (gastroesophageal reflux disease)    pt states this is monitored and no medication has been prescribed   . Hyperlipidemia    pt states this is monitored and no medication has been prescribed   . Hypertension    pt states this is monitored and no medication has been prescribed   . Vitamin D deficiency     Past Surgical History:  Procedure Laterality Date  . ANKLE SURGERY Right 2009  . LUMBAR LAMINECTOMY & DISCECTOMY  08/27/2018  . OPEN REDUCTION INTERNAL FIXATION (ORIF) METACARPAL Left 05/25/2014   Procedure: OPEN REDUCTION INTERNAL FIXATION (ORIF) LEFT RING FINGER METACARPAL;  Surgeon: Daryll Brod, MD;  Location: [place];  Service: Orthopedics;  Laterality: Left;    There were no vitals filed for this visit.  Subjective Assessment - 03/16/19 1149    Subjective  "I am still having the N/T in the foot/ great toe, and tightness in the calf / foot"    Pertinent History  lumbar laminectomy 08/27/2018, L drop foot (AFO), DM II    Patient Stated Goals  I want to improve my balance, I want to strengthen my knee,  and be able to bend/squat.    Currently in Pain?  Yes    Pain Score  2     Pain Orientation  Left    Pain Descriptors / Indicators  Aching    Pain Type  Chronic pain    Pain Onset  More than a month ago    Pain Frequency  Intermittent                       [facility] Adult PT Treatment/Exercise - 03/16/19 0001      Exercises   Exercises  Knee/Hip;Ankle      Manual Therapy   Manual Therapy  Joint mobilization    Manual therapy comments  skilled palpation and monitoring of pt during TPDN    Joint Mobilization  grade III PA/AP and Grade V distraction with cavitation noted      Ankle Exercises: Stretches   Other Stretch  DF stretch 2 x 30 sec      Ankle Exercises: Sidelying   Other Sidelying Ankle Exercises  2x 20 in L sidelying       Trigger Point Dry Needling - 03/16/19 0001    Consent Given?  Yes    Education Handout Provided  Yes    Muscles Treated Lower Quadrant  Anterior tibialis  Electrical Stimulation Performed with Dry Needling  Yes    E-stim with Dry Needling Details  E-stim point tool to promote activation of the anterior tib combined with actvit DF x 8 min   trace contraction noted   Anterior tibialis Response  Twitch response elicited;Palpable increased muscle length           PT Education - 03/16/19 1238    Education Details  updated HEP for sidelying ankle DF    Person(s) Educated  Patient    Methods  Explanation;Verbal cues    Comprehension  Verbalized understanding;Verbal cues required       PT Short Term Goals - 02/22/19 1136      PT SHORT TERM GOAL #1   Title  Pt will improve his 5 time sit to stand to </= 20 seconds using UE support as needed.    Baseline  40.5 seconds (02/22/2019)    Time  4    Period  Weeks    Status  New    Target Date  03/24/19      PT SHORT TERM GOAL #2   Title  Pt will improve his bilateral hamstring flexibility to >/= 80 degrees.    Baseline  R: 65 degrees, L: 70 degrees    Time  4    Period  Weeks     Status  New    Target Date  03/24/19      PT SHORT TERM GOAL #3   Title  Pt will be able to demonstrate SLS with no pelvis drop.    Baseline  trendelenburg hip drop    Time  4    Period  Weeks    Status  New    Target Date  03/23/19        PT Long Term Goals - 02/22/19 1342      PT LONG TERM GOAL #1   Title  Pt will ambulate without trendelenburg gait pattern for >/= 500 feet.    Baseline  mild trendelenburg gait using a quad cane and L AFO    Time  8    Period  Weeks    Status  New    Target Date  04/20/19      PT LONG TERM GOAL #2   Title  Pt will improve his R LE strength to >/= 4+/5.    Baseline  see flowsheet    Time  8    Period  Weeks    Status  New      PT LONG TERM GOAL #3   Title  Pt will demo ability to squat and return to stand while lifting objects    Baseline  lacking balance and strength necessary at eval    Time  8    Period  Weeks    Status  New    Target Date  04/14/19      PT LONG TERM GOAL #4   Title  Pt will improve his BERG balance score by >/= 10 points.    Baseline  BERG: 26/56 on 02/22/2019    Time  8    Period  Weeks    Status  New    Target Date  04/14/19            Plan - 03/16/19 1232    Clinical Impression Statement  pt reports conintued soreness/ tightness along the anterior tib and gastroc. Focused session on activation of the L ankle DF. trialed DN with E-stim combined with muscle activatin  to promote strengthening. He does have a trace of a contraction when laying on the side with gravity eliminated.    Comorbidities  DM, HTN, ORIF metacarpal, hyperlipidemia, vit-D deficiency, discectomy, lumbar laminectomy, ankle surgery, diabetic amyotrophy, foot drop with AFO    PT Treatment/Interventions  ADLs/Self Care Home Management;Moist Heat;Ultrasound;Gait training;Stair training;Functional mobility training;Therapeutic activities;Therapeutic exercise;Balance training;Neuromuscular re-education;Patient/family education;Manual  techniques;Passive range of motion;Taping;Dry needling    PT Next Visit Plan  ankle DF activation, AAROM execises.    PT Home Exercise Plan  Access Code: [email or web address] (bridges, standing achilles stretch with towel under midfoot) DF strengthening in sidelying       Patient will benefit from skilled therapeutic intervention in order to improve the following deficits and impairments:  Pain, Decreased strength, Decreased activity tolerance, Decreased range of motion, Abnormal gait, Decreased balance, Decreased mobility, Difficulty walking, Impaired flexibility  Visit Diagnosis: Foot drop, left  Acute left-sided low back pain without sciatica  Muscle weakness (generalized)  Other abnormalities of gait and mobility     Problem List Patient Active Problem List   Diagnosis Date Noted  . 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 Diabetic neuropathy (HCC) 01/15/2019  . Acute pain of right knee 01/11/2019  . Trochanteric bursitis of right hip 01/11/2019  . Hypertension   . Hyperlipidemia   . Diabetes mellitus without complication (HCC)   . GERD (gastroesophageal reflux disease)   . Vitamin D deficiency   . Encounter for long-term (current) use of other medications     Lulu RidingKristoffer Kieu Quiggle PT, DPT, LAT, ATC  03/16/19  12:39 PM      [facility] [address] [place], [place], [address] Phone: [phone]   Fax:  [phone]  Name: Royetta CarBernard Louks MRN: [id number] Date of Birth: 08-08-1970

## 2019-03-21 ENCOUNTER — Ambulatory Visit: Payer: PRIVATE HEALTH INSURANCE | Admitting: Physical Therapy

## 2019-03-23 ENCOUNTER — Ambulatory Visit: Payer: PRIVATE HEALTH INSURANCE | Attending: Neurosurgery | Admitting: Physical Therapy

## 2019-03-23 ENCOUNTER — Other Ambulatory Visit: Payer: Self-pay

## 2019-03-23 DIAGNOSIS — M545 Low back pain, unspecified: Secondary | ICD-10-CM

## 2019-03-23 DIAGNOSIS — R2689 Other abnormalities of gait and mobility: Secondary | ICD-10-CM | POA: Insufficient documentation

## 2019-03-23 DIAGNOSIS — M21372 Foot drop, left foot: Secondary | ICD-10-CM | POA: Insufficient documentation

## 2019-03-23 DIAGNOSIS — M6281 Muscle weakness (generalized): Secondary | ICD-10-CM | POA: Diagnosis present

## 2019-03-23 NOTE — Therapy (Signed)
[facility] [address] [place], [place], [address] Phone: [phone]   Fax:  [phone]  Physical Therapy Treatment  Patient Details  Name: Royetta CarBernard Kihara MRN: [id number] Date of Birth: [birth year] Referring Provider (PT): Maeola HarmanJoseph Stern, MD   Encounter Date: 03/23/2019  PT End of Session - 03/23/19 1155    Visit Number  3    Number of Visits  16    Date for PT Re-Evaluation  04/22/19    Authorization Type  UMR    PT Start Time  1155   pt arrived 10 min late   PT Stop Time  1228    PT Time Calculation (min)  33 min    Activity Tolerance  Patient tolerated treatment well       Past Medical History:  Diagnosis Date  . Diabetes mellitus without complication (HCC)    on Metformin and insulin  . Encounter for long-term (current) use of other medications   . GERD (gastroesophageal reflux disease)    pt states this is monitored and no medication has been prescribed   . Hyperlipidemia    pt states this is monitored and no medication has been prescribed   . Hypertension    pt states this is monitored and no medication has been prescribed   . Vitamin D deficiency     Past Surgical History:  Procedure Laterality Date  . ANKLE SURGERY Right 2009  . LUMBAR LAMINECTOMY & DISCECTOMY  08/27/2018  . OPEN REDUCTION INTERNAL FIXATION (ORIF) METACARPAL Left 05/25/2014   Procedure: OPEN REDUCTION INTERNAL FIXATION (ORIF) LEFT RING FINGER METACARPAL;  Surgeon: Cindee SaltGary Kuzma, MD;  Location: [facility];  Service: Orthopedics;  Laterality: Left;    There were no vitals filed for this visit.  Subjective Assessment - 03/23/19 1159    Subjective  "I was alittle sore after the last session, and did get some swelling in the foot"    Patient Stated Goals  I want to improve my balance, I want to strengthen my knee, and be able to bend/squat.    Currently in Pain?  Yes    Pain Score  2     Pain Orientation  Left    Pain  Descriptors / Indicators  Aching    Pain Type  Chronic pain    Pain Onset  More than a month ago    Pain Frequency  Intermittent         [facility] PT Assessment - 03/23/19 0001      Assessment   Medical Diagnosis  lumbar radiculopathy, L foot drop    Referring Provider (PT)  Maeola HarmanJoseph Stern, MD                   [facility] - 03/23/19 0001      Knee/Hip Exercises: Aerobic   Nustep   L 4 x 5 min LE      Knee/Hip Exercises: Standing   Hip Flexion  Stengthening;Both;2 sets;10 reps   alternating L/R focusing on eccentric control/stability     Knee/Hip Exercises: Sidelying   Hip ABduction  Strengthening;Left;2 sets;15 reps      Manual Therapy   Manual Therapy  Taping    Kinesiotex  Facilitate Muscle      Kinesiotix   Facilitate Muscle   anterior tib      Ankle Exercises: Seated   Other Seated Ankle Exercises  LLE only rocking back on bosu and pushing down through heel holding 5 seconds 1  x 10   BOSU to provide AAROM for DF   Other Seated Ankle Exercises  rocker board 1 x 20 bil LE focusing on DF 1 x 10 oscilating at end range   using RLE on rockerboard to assist with nuero re-ed     Ankle Exercises: Stretches   Other Stretch  DF stretch 2 x 30 sec               PT Short Term Goals - 02/22/19 1136      PT SHORT TERM GOAL #1   Title  Pt will improve his 5 time sit to stand to </= 20 seconds using UE support as needed.    Baseline  40.5 seconds (02/22/2019)    Time  4    Period  Weeks    Status  New    Target Date  03/24/19      PT SHORT TERM GOAL #2   Title  Pt will improve his bilateral hamstring flexibility to >/= 80 degrees.    Baseline  R: 65 degrees, L: 70 degrees    Time  4    Period  Weeks    Status  New    Target Date  03/24/19      PT SHORT TERM GOAL #3   Title  Pt will be able to demonstrate SLS with no pelvis drop.    Baseline  trendelenburg hip drop    Time  4    Period  Weeks    Status  New    Target Date   03/23/19        PT Long Term Goals - 02/22/19 1342      PT LONG TERM GOAL #1   Title  Pt will ambulate without trendelenburg gait pattern for >/= 500 feet.    Baseline  mild trendelenburg gait using a quad cane and L AFO    Time  8    Period  Weeks    Status  New    Target Date  04/20/19      PT LONG TERM GOAL #2   Title  Pt will improve his R LE strength to >/= 4+/5.    Baseline  see flowsheet    Time  8    Period  Weeks    Status  New      PT LONG TERM GOAL #3   Title  Pt will demo ability to squat and return to stand while lifting objects    Baseline  lacking balance and strength necessary at eval    Time  8    Period  Weeks    Status  New    Target Date  04/14/19      PT LONG TERM GOAL #4   Title  Pt will improve his BERG balance score by >/= 10 points.    Baseline  BERG: 26/56 on 02/22/2019    Time  8    Period  Weeks    Status  New    Target Date  04/14/19            Plan - 03/23/19 1235    Clinical Impression Statement  pt arrived 10 min late today, Kable reported some sorness after the last visit but does note feeling like he is getting stronger. Continued working on [employer] for L DF and trialed KT taping to help facilitate activation. Continued hip strengthening which he does fatigue quickly with.    Comorbidities  DM, HTN, ORIF metacarpal, hyperlipidemia, vit-D  deficiency, discectomy, lumbar laminectomy, ankle surgery, diabetic amyotrophy, foot drop with AFO    PT Treatment/Interventions  ADLs/Self Care Home Management;Moist Heat;Ultrasound;Gait training;Stair training;Functional mobility training;Therapeutic activities;Therapeutic exercise;Balance training;Neuromuscular re-education;Patient/family education;Manual techniques;Passive range of motion;Taping;Dry needling    PT Next Visit Plan  ankle DF activation, AAROM execises. hip/knee strengthening, balance training, step up/ down    PT Home Exercise Plan  Access Code: [email or web address] (bridges, standing achilles  stretch with towel under midfoot) DF strengthening in sidelying    Consulted and Agree with Plan of Care  Patient       Patient will benefit from skilled therapeutic intervention in order to improve the following deficits and impairments:     Visit Diagnosis: Foot drop, left  Acute left-sided low back pain without sciatica  Muscle weakness (generalized)  Other abnormalities of gait and mobility     Problem List Patient Active Problem List   Diagnosis Date Noted  . 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 Diabetic neuropathy (HCC) 01/15/2019  . Acute pain of right knee 01/11/2019  . Trochanteric bursitis of right hip 01/11/2019  . Hypertension   . Hyperlipidemia   . Diabetes mellitus without complication (HCC)   . GERD (gastroesophageal reflux disease)   . Vitamin D deficiency   . Encounter for long-term (current) use of other medications    Lulu Riding PT, DPT, LAT, ATC  03/23/19  12:38 PM      [facility] [address] [place], [place], [address] Phone: [phone]   Fax:  [phone]  Name: Constantin Hillery MRN: [id number] Date of Birth: 1970-07-10

## 2019-03-28 ENCOUNTER — Encounter: Payer: Self-pay | Admitting: Physical Therapy

## 2019-03-28 ENCOUNTER — Ambulatory Visit: Payer: PRIVATE HEALTH INSURANCE | Admitting: Physical Therapy

## 2019-03-28 ENCOUNTER — Other Ambulatory Visit: Payer: Self-pay

## 2019-03-28 DIAGNOSIS — R2689 Other abnormalities of gait and mobility: Secondary | ICD-10-CM

## 2019-03-28 DIAGNOSIS — M545 Low back pain, unspecified: Secondary | ICD-10-CM

## 2019-03-28 DIAGNOSIS — M21372 Foot drop, left foot: Secondary | ICD-10-CM

## 2019-03-28 DIAGNOSIS — M6281 Muscle weakness (generalized): Secondary | ICD-10-CM

## 2019-03-28 NOTE — Therapy (Signed)
[place], [place], [address] Phone: [phone]   Fax:  [phone]  Physical Therapy Treatment  Patient Details  Name: William Moran MRN: [id number] Date of Birth: 09-30-1970 Referring Provider (PT): Erline Levine, MD   Encounter Date: 03/28/2019  PT End of Session - 03/28/19 1026    Visit Number  4    Number of Visits  16    Date for PT Re-Evaluation  04/22/19    Authorization Type  UMR    PT Start Time  1022    PT Stop Time  1100    PT Time Calculation (min)  38 min    Activity Tolerance  Patient tolerated treatment well    Behavior During Therapy  [facility] for tasks assessed/performed       Past Medical History:  Diagnosis Date  . Diabetes mellitus without complication (HCC)    on Metformin and insulin  . Encounter for long-term (current) use of other medications   . GERD (gastroesophageal reflux disease)    pt states this is monitored and no medication has been prescribed   . Hyperlipidemia    pt states this is monitored and no medication has been prescribed   . Hypertension    pt states this is monitored and no medication has been prescribed   . Vitamin D deficiency     Past Surgical History:  Procedure Laterality Date  . ANKLE SURGERY Right 2009  . LUMBAR LAMINECTOMY & DISCECTOMY  08/27/2018  . OPEN REDUCTION INTERNAL FIXATION (ORIF) METACARPAL Left 05/25/2014   Procedure: OPEN REDUCTION INTERNAL FIXATION (ORIF) LEFT RING FINGER METACARPAL;  Surgeon: Daryll Brod, MD;  Location: [place];  Service: Orthopedics;  Laterality: Left;    There were no vitals filed for this visit.  Subjective Assessment - 03/28/19 1025    Subjective  Pt reporting having a good weekend. Pt reporting his exercises and stetches are going good.    Pertinent History  lumbar laminectomy 08/27/2018, L drop foot (AFO), DM II    How long can you walk comfortably?  15 minutes    Patient Stated Goals  I  want to improve my balance, I want to strengthen my knee, and be able to bend/squat.    Currently in Pain?  No/denies         [facility] PT Assessment - 03/28/19 0001      Assessment   Medical Diagnosis  lumbar radiculopathy, L foot drop    Referring Provider (PT)  Erline Levine, MD      Transfers   Five time sit to stand comments   17 seconds from mat table using bilateral UE for support                   [facility] Adult PT Treatment/Exercise - 03/28/19 0001      Knee/Hip Exercises: Stretches   Active Hamstring Stretch  Left;3 reps;30 seconds    Active Hamstring Stretch Limitations  using strap      Knee/Hip Exercises: Aerobic   Nustep   L 4 x 5 min LE      Knee/Hip Exercises: Standing   Hip Flexion  Stengthening;Both;2 sets;15 reps;Knee bent   alternating L/R focusing on eccentric control/stability   Other Standing Knee Exercises  SLS: R: 5 seconds, L: 1 second with no hip drop noted today       Knee/Hip Exercises: Supine   Straight Leg Raises  Strengthening;2 sets;10 reps      Knee/Hip  Exercises: Sidelying   Hip ABduction  Strengthening;Left;2 sets;15 reps      Manual Therapy   Manual Therapy  Taping    Kinesiotex  Facilitate Muscle      Kinesiotix   Facilitate Muscle   left anterior tib      Ankle Exercises: Seated   Other Seated Ankle Exercises  rocker board 1 x 20 bil LE focusing on DF 1 x 10 oscilating at end range   using RLE on rockerboard to assist with nuero re-ed     Ankle Exercises: Stretches   Other Stretch  DF stretch 2 x 30 sec               PT Short Term Goals - 03/28/19 1029      PT SHORT TERM GOAL #1   Title  Pt will improve his 5 time sit to stand to </= 20 seconds using UE support as needed.    Baseline  40.5 seconds (02/22/2019)    Period  Weeks    Target Date  03/24/19      PT SHORT TERM GOAL #2   Title  Pt will improve his bilateral hamstring flexibility to >/= 80 degrees.    Baseline  R: 65 degrees, L: 70 degrees    Time   4    Period  Weeks    Status  On-going      PT SHORT TERM GOAL #3   Title  Pt will be able to demonstrate SLS with no pelvis drop.    Baseline  trendelenburg hip drop    Time  4    Period  Weeks    Status  On-going    Target Date  03/23/19        PT Long Term Goals - 02/22/19 1342      PT LONG TERM GOAL #1   Title  Pt will ambulate without trendelenburg gait pattern for >/= 500 feet.    Baseline  mild trendelenburg gait using a quad cane and L AFO    Time  8    Period  Weeks    Status  New    Target Date  04/20/19      PT LONG TERM GOAL #2   Title  Pt will improve his R LE strength to >/= 4+/5.    Baseline  see flowsheet    Time  8    Period  Weeks    Status  New      PT LONG TERM GOAL #3   Title  Pt will demo ability to squat and return to stand while lifting objects    Baseline  lacking balance and strength necessary at eval    Time  8    Period  Weeks    Status  New    Target Date  04/14/19      PT LONG TERM GOAL #4   Title  Pt will improve his BERG balance score by >/= 10 points.    Baseline  BERG: 26/56 on 02/22/2019    Time  8    Period  Weeks    Status  New    Target Date  04/14/19            Plan - 03/28/19 1027    Clinical Impression Statement  Pt arriving to therapy 7 minutes late. Pt reporting no pain and pt feels his HEP is going well. Pt has improved on his 5 time sit to stand from 40 seconds to 17  sesconds using bilateral UE's. Pt tolerating exercises for heel cord stretching and strengthening.Pt with fatigue reported.  Still progressing AAROM. Rock Tape applied to L anterior tib and pt was instructed on removal in a couple of days.  Continue skilled PT.    Personal Factors and Comorbidities  Comorbidity 3+    Comorbidities  DM, HTN, ORIF metacarpal, hyperlipidemia, vit-D deficiency, discectomy, lumbar laminectomy, ankle surgery, diabetic amyotrophy, foot drop with AFO    Examination-Activity Limitations  Stairs;Stand;Squat     Examination-Participation Restrictions  Community Activity;Other    Stability/Clinical Decision Making  Evolving/Moderate complexity    Rehab Potential  Good    PT Frequency  2x / week    PT Treatment/Interventions  ADLs/Self Care Home Management;Moist Heat;Ultrasound;Gait training;Stair training;Functional mobility training;Therapeutic activities;Therapeutic exercise;Balance training;Neuromuscular re-education;Patient/family education;Manual techniques;Passive range of motion;Taping;Dry needling    PT Next Visit Plan  ankle DF activation, AAROM execises. hip/knee strengthening, balance training, step up/ down    PT Home Exercise Plan  Access Code: [email or web address] (bridges, standing achilles stretch with towel under midfoot) DF strengthening in sidelying    Consulted and Agree with Plan of Care  Patient       Patient will benefit from skilled therapeutic intervention in order to improve the following deficits and impairments:  Pain, Decreased strength, Decreased activity tolerance, Decreased range of motion, Abnormal gait, Decreased balance, Decreased mobility, Difficulty walking, Impaired flexibility  Visit Diagnosis: Foot drop, left  Acute left-sided low back pain without sciatica  Muscle weakness (generalized)  Other abnormalities of gait and mobility     Problem List Patient Active Problem List   Diagnosis Date Noted  . 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 Diabetic neuropathy (HCC) 01/15/2019  . Acute pain of right knee 01/11/2019  . Trochanteric bursitis of right hip 01/11/2019  . Hypertension   . Hyperlipidemia   . Diabetes mellitus without complication (HCC)   . GERD (gastroesophageal reflux disease)   . Vitamin D deficiency   . Encounter for long-term (current) use of other medications     Sharmon LeydenJennifer R Oswaldo Cueto, PT 03/28/2019, 11:04 AM  [facility] [address] [place], [place], [address] Phone: [phone]   Fax:  [phone]  Name: Royetta CarBernard Gersten MRN: [id number] Date of Birth: 12-02-1970

## 2019-03-30 ENCOUNTER — Ambulatory Visit: Payer: PRIVATE HEALTH INSURANCE | Attending: Neurosurgery | Admitting: Physical Therapy

## 2019-03-30 ENCOUNTER — Other Ambulatory Visit: Payer: Self-pay

## 2019-03-30 ENCOUNTER — Encounter: Payer: Self-pay | Admitting: Physical Therapy

## 2019-03-30 DIAGNOSIS — M545 Low back pain, unspecified: Secondary | ICD-10-CM

## 2019-03-30 DIAGNOSIS — R2689 Other abnormalities of gait and mobility: Secondary | ICD-10-CM | POA: Insufficient documentation

## 2019-03-30 DIAGNOSIS — M6281 Muscle weakness (generalized): Secondary | ICD-10-CM | POA: Insufficient documentation

## 2019-03-30 DIAGNOSIS — M21372 Foot drop, left foot: Secondary | ICD-10-CM | POA: Insufficient documentation

## 2019-03-30 NOTE — Therapy (Signed)
[facility] [address] [place], [place], [address] Phone: [phone]   Fax:  [phone]  Physical Therapy Treatment  Patient Details  Name: William Moran MRN: [id number] Date of Birth: 13-Nov-1970 Referring Provider (PT): Maeola Harman, MD   Encounter Date: 03/30/2019  PT End of Session - 03/30/19 1027    Visit Number  5    Number of Visits  16    Date for PT Re-Evaluation  04/22/19    Authorization Type  UMR    PT Start Time  1027   pt arrived 12 min late today   PT Stop Time  1101    PT Time Calculation (min)  34 min    Activity Tolerance  Patient tolerated treatment well    Behavior During Therapy  [facility] for tasks assessed/performed       Past Medical History:  Diagnosis Date  . Diabetes mellitus without complication (HCC)    on Metformin and insulin  . Encounter for long-term (current) use of other medications   . GERD (gastroesophageal reflux disease)    pt states this is monitored and no medication has been prescribed   . Hyperlipidemia    pt states this is monitored and no medication has been prescribed   . Hypertension    pt states this is monitored and no medication has been prescribed   . Vitamin D deficiency     Past Surgical History:  Procedure Laterality Date  . ANKLE SURGERY Right 2009  . LUMBAR LAMINECTOMY & DISCECTOMY  08/27/2018  . OPEN REDUCTION INTERNAL FIXATION (ORIF) METACARPAL Left 05/25/2014   Procedure: OPEN REDUCTION INTERNAL FIXATION (ORIF) LEFT RING FINGER METACARPAL;  Surgeon: Cindee Salt, MD;  Location: [facility];  Service: Orthopedics;  Laterality: Left;    There were no vitals filed for this visit.  Subjective Assessment - 03/30/19 1030    Subjective  " I am doing pretty good today. I have had a few good days after the last few sessions"    Patient Stated Goals  I want to improve my balance, I want to strengthen my knee, and be able to bend/squat.    Currently in  Pain?  No/denies    Pain Orientation  Left                       [facility] Adult PT Treatment/Exercise - 03/30/19 0001      Lumbar Exercises: Supine   Bent Knee Raise  10 reps   keep core tight alternating L/R     Knee/Hip Exercises: Aerobic   Nustep  L6 x 5 min LE only      Knee/Hip Exercises: Seated   Other Seated Knee/Hip Exercises  hip extension with foot on back of rockerbard focusing pressing down through hip and DF in the ankle using Rocker board as active assist 2 x 10 holding 3 seconds      Knee/Hip Exercises: Supine   Straight Leg Raises  1 set;20 reps;Left;Strengthening      Knee/Hip Exercises: Sidelying   Hip ABduction  1 set;20 reps;Strengthening;Left   in R sidelying   Hip ADduction  Strengthening;Left;1 set;20 reps   in L sidelying     Knee/Hip Exercises: Prone   Hamstring Curl  1 set;15 reps   with hip extension   Hamstring Curl Limitations  cues to keep foot DF as much as possible      Manual Therapy   Kinesiotex  Facilitate Muscle  Kinesiotix   Facilitate Muscle   left anterior tib   increasing pull to 75%     Ankle Exercises: Seated   Other Seated Ankle Exercises  rocker board 1 x 20 bil LE focusing on DF 1 x 10 oscilating at end range      Ankle Exercises: Sidelying   Other Sidelying Ankle Exercises  1 x 20 L ankle DF                PT Short Term Goals - 03/28/19 1029      PT SHORT TERM GOAL #1   Title  Pt will improve his 5 time sit to stand to </= 20 seconds using UE support as needed.    Baseline  40.5 seconds (02/22/2019)    Period  Weeks    Target Date  03/24/19      PT SHORT TERM GOAL #2   Title  Pt will improve his bilateral hamstring flexibility to >/= 80 degrees.    Baseline  R: 65 degrees, L: 70 degrees    Time  4    Period  Weeks    Status  On-going      PT SHORT TERM GOAL #3   Title  Pt will be able to demonstrate SLS with no pelvis drop.    Baseline  trendelenburg hip drop    Time  4    Period   Weeks    Status  On-going    Target Date  03/23/19        PT Long Term Goals - 02/22/19 1342      PT LONG TERM GOAL #1   Title  Pt will ambulate without trendelenburg gait pattern for >/= 500 feet.    Baseline  mild trendelenburg gait using a quad cane and L AFO    Time  8    Period  Weeks    Status  New    Target Date  04/20/19      PT LONG TERM GOAL #2   Title  Pt will improve his R LE strength to >/= 4+/5.    Baseline  see flowsheet    Time  8    Period  Weeks    Status  New      PT LONG TERM GOAL #3   Title  Pt will demo ability to squat and return to stand while lifting objects    Baseline  lacking balance and strength necessary at eval    Time  8    Period  Weeks    Status  New    Target Date  04/14/19      PT LONG TERM GOAL #4   Title  Pt will improve his BERG balance score by >/= 10 points.    Baseline  BERG: 26/56 on 02/22/2019    Time  8    Period  Weeks    Status  New    Target Date  04/14/19            Plan - 03/30/19 1031    Clinical Impression Statement  pt arrived 12 min late today. due to limited time continud working on strengthening of bil LE and continued applying KT tape for ankle DF activation. worked on Designer, fashion/clothingcore strengthening which he did well with. no report of pain or issues end of session.    PT Treatment/Interventions  ADLs/Self Care Home Management;Moist Heat;Ultrasound;Gait training;Stair training;Functional mobility training;Therapeutic activities;Therapeutic exercise;Balance training;Neuromuscular re-education;Patient/family education;Manual techniques;Passive range of motion;Taping;Dry needling  PT Next Visit Plan  ankle DF activation, AAROM execises. hip/knee strengthening, balance training, step up/ down    PT Home Exercise Plan  Access Code: [email or web address] (bridges, standing achilles stretch with towel under midfoot) DF strengthening in sidelying    Consulted and Agree with Plan of Care  Patient       Patient will benefit from skilled  therapeutic intervention in order to improve the following deficits and impairments:  Pain, Decreased strength, Decreased activity tolerance, Decreased range of motion, Abnormal gait, Decreased balance, Decreased mobility, Difficulty walking, Impaired flexibility  Visit Diagnosis: Foot drop, left  Acute left-sided low back pain without sciatica  Muscle weakness (generalized)  Other abnormalities of gait and mobility     Problem List Patient Active Problem List   Diagnosis Date Noted  . 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 Diabetic neuropathy ([place]) 01/15/2019  . Acute pain of right knee 01/11/2019  . Trochanteric bursitis of right hip 01/11/2019  . Hypertension   . Hyperlipidemia   . Diabetes mellitus without complication ([place])   . GERD (gastroesophageal reflux disease)   . Vitamin D deficiency   . Encounter for long-term (current) use of other medications    [name] PT, DPT, LAT, ATC  03/30/19  11:04 AM      [place], [place], [address] Phone: [phone]   Fax:  [phone]  Name: William Moran MRN: [id number] Date of Birth: 12/24/1970

## 2019-03-31 DIAGNOSIS — E1144 Type 2 diabetes mellitus with diabetic amyotrophy: Secondary | ICD-10-CM | POA: Diagnosis not present

## 2019-03-31 DIAGNOSIS — E1165 Type 2 diabetes mellitus with hyperglycemia: Secondary | ICD-10-CM | POA: Diagnosis not present

## 2019-03-31 DIAGNOSIS — I10 Essential (primary) hypertension: Secondary | ICD-10-CM | POA: Diagnosis not present

## 2019-03-31 DIAGNOSIS — E782 Mixed hyperlipidemia: Secondary | ICD-10-CM | POA: Diagnosis not present

## 2019-04-04 ENCOUNTER — Ambulatory Visit: Payer: PRIVATE HEALTH INSURANCE | Admitting: Physical Therapy

## 2019-04-04 ENCOUNTER — Other Ambulatory Visit: Payer: Self-pay

## 2019-04-04 ENCOUNTER — Encounter: Payer: Self-pay | Admitting: Physical Therapy

## 2019-04-04 DIAGNOSIS — M6281 Muscle weakness (generalized): Secondary | ICD-10-CM

## 2019-04-04 DIAGNOSIS — M21372 Foot drop, left foot: Secondary | ICD-10-CM

## 2019-04-04 DIAGNOSIS — M545 Low back pain, unspecified: Secondary | ICD-10-CM

## 2019-04-04 DIAGNOSIS — R2689 Other abnormalities of gait and mobility: Secondary | ICD-10-CM

## 2019-04-04 NOTE — Therapy (Addendum)
[place], [place], [address] Phone: [phone]   Fax:  [phone]  Physical Therapy Treatment / Discharge  Patient Details  Name: William Moran MRN: [id number] Date of Birth: 02-May-1970 Referring Provider (PT): Erline Levine, MD   Encounter Date: 04/04/2019  PT End of Session - 04/04/19 1023    Visit Number  6    Number of Visits  16    Date for PT Re-Evaluation  04/22/19    Authorization Type  UMR    PT Start Time  1024   pt arrived 9 min late   PT Stop Time  1058    PT Time Calculation (min)  34 min    Activity Tolerance  Patient tolerated treatment well       Past Medical History:  Diagnosis Date  . Diabetes mellitus without complication (HCC)    on Metformin and insulin  . Encounter for long-term (current) use of other medications   . GERD (gastroesophageal reflux disease)    pt states this is monitored and no medication has been prescribed   . Hyperlipidemia    pt states this is monitored and no medication has been prescribed   . Hypertension    pt states this is monitored and no medication has been prescribed   . Vitamin D deficiency     Past Surgical History:  Procedure Laterality Date  . ANKLE SURGERY Right 2009  . LUMBAR LAMINECTOMY & DISCECTOMY  08/27/2018  . OPEN REDUCTION INTERNAL FIXATION (ORIF) METACARPAL Left 05/25/2014   Procedure: OPEN REDUCTION INTERNAL FIXATION (ORIF) LEFT RING FINGER METACARPAL;  Surgeon: Daryll Brod, MD;  Location: [facility];  Service: Orthopedics;  Laterality: Left;    There were no vitals filed for this visit.  Subjective Assessment - 04/04/19 1024    Subjective  "feeling pretty decent today, some soreness in the knees but I think its related to the weather"         [facility] PT Assessment - 04/04/19 0001      Assessment   Medical Diagnosis  lumbar radiculopathy, L foot drop    Referring Provider (PT)  Erline Levine, MD                    [facility] Adult PT Treatment/Exercise - 04/04/19 0001      Knee/Hip Exercises: Aerobic   Elliptical  L3 x 4 min ramp L1    Nustep  L6 x 4 min LE only      Knee/Hip Exercises: Standing   Hip Flexion  Stengthening;Both;2 sets;20 reps   with 1 HHA for stability on counter     Knee/Hip Exercises: Seated   Sit to Sand  10 reps;3 sets   2nd and 3rd set leading the LLE forward         Balance Exercises - 04/04/19 1059      Balance Exercises: Standing   Standing Eyes Opened  Narrow base of support (BOS);2 reps;30 secs    Standing Eyes Closed  Narrow base of support (BOS);2 reps;30 secs    Partial Tandem Stance  Eyes open;2 reps;30 secs   bil         PT Short Term Goals - 03/28/19 1029      PT SHORT TERM GOAL #1   Title  Pt will improve his 5 time sit to stand to </= 20 seconds using UE support as needed.    Baseline  40.5 seconds (02/22/2019)    Period  Weeks    Target Date  03/24/19      PT SHORT TERM GOAL #2   Title  Pt will improve his bilateral hamstring flexibility to >/= 80 degrees.    Baseline  R: 65 degrees, L: 70 degrees    Time  4    Period  Weeks    Status  On-going      PT SHORT TERM GOAL #3   Title  Pt will be able to demonstrate SLS with no pelvis drop.    Baseline  trendelenburg hip drop    Time  4    Period  Weeks    Status  On-going    Target Date  03/23/19        PT Long Term Goals - 02/22/19 1342      PT LONG TERM GOAL #1   Title  Pt will ambulate without trendelenburg gait pattern for >/= 500 feet.    Baseline  mild trendelenburg gait using a quad cane and L AFO    Time  8    Period  Weeks    Status  New    Target Date  04/20/19      PT LONG TERM GOAL #2   Title  Pt will improve his R LE strength to >/= 4+/5.    Baseline  see flowsheet    Time  8    Period  Weeks    Status  New      PT LONG TERM GOAL #3   Title  Pt will demo ability to squat and return to stand while lifting objects    Baseline  lacking  balance and strength necessary at eval    Time  8    Period  Weeks    Status  New    Target Date  04/14/19      PT LONG TERM GOAL #4   Title  Pt will improve his BERG balance score by >/= 10 points.    Baseline  BERG: 26/56 on 02/22/2019    Time  8    Period  Weeks    Status  New    Target Date  04/14/19            Plan - 04/04/19 1101    Clinical Impression Statement  pt arrived 9 min late today. Continued hip/ knee strnegthening progressing to standing with UE support using the counter. practiced sit to stand avoding R knee hyper extension and leading slighlty with the LLE to promote RLE activation. continued working on balance which he did well with noting mild postural sway in modified tandem.    PT Treatment/Interventions  ADLs/Self Care Home Management;Moist Heat;Ultrasound;Gait training;Stair training;Functional mobility training;Therapeutic activities;Therapeutic exercise;Balance training;Neuromuscular re-education;Patient/family education;Manual techniques;Passive range of motion;Taping;Dry needling    PT Next Visit Plan  ankle DF activation, AAROM execises. hip/knee strengthening, balance training, step up/ down, update HEP for standing balance    PT Home Exercise Plan  Access Code: [email or web address] (bridges, standing achilles stretch with towel under midfoot) DF strengthening in sidelying    Consulted and Agree with Plan of Care  Patient       Patient will benefit from skilled therapeutic intervention in order to improve the following deficits and impairments:  Pain, Decreased strength, Decreased activity tolerance, Decreased range of motion, Abnormal gait, Decreased balance, Decreased mobility, Difficulty walking, Impaired flexibility  Visit Diagnosis: Foot drop, left  Acute left-sided low back pain without sciatica  Muscle weakness (generalized)  Other abnormalities of gait and  mobility     Problem List Patient Active Problem List   Diagnosis Date Noted  . 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 Diabetic neuropathy ([place]) 01/15/2019  . Acute pain of right knee 01/11/2019  . Trochanteric bursitis of right hip 01/11/2019  . Hypertension   . Hyperlipidemia   . Diabetes mellitus without complication ([place])   . GERD (gastroesophageal reflux disease)   . Vitamin D deficiency   . Encounter for long-term (current) use of other medications    Emmaleigh Longo PT, DPT, LAT, ATC  04/04/19  11:03 AM      [place], [place], [address] Phone: [phone]   Fax:  [phone]  Name: William Moran MRN: [id number] Date of Birth: 11-30-1970        PHYSICAL THERAPY DISCHARGE SUMMARY  Visits from Start of Care: 6 Current functional level related to goals / functional outcomes: See goals   Remaining deficits: Current status unknown   Education / Equipment: HEP  Plan: Patient agrees to discharge.  Patient goals were not met. Patient is being discharged due to not returning since the last visit.  ?????         Keyondre Hepburn PT, DPT, LAT, ATC  05/30/19  12:43 PM

## 2019-04-19 DIAGNOSIS — E1165 Type 2 diabetes mellitus with hyperglycemia: Secondary | ICD-10-CM | POA: Diagnosis not present

## 2019-04-19 DIAGNOSIS — Z6827 Body mass index (BMI) 27.0-27.9, adult: Secondary | ICD-10-CM | POA: Diagnosis not present

## 2019-04-19 DIAGNOSIS — E119 Type 2 diabetes mellitus without complications: Secondary | ICD-10-CM | POA: Diagnosis not present

## 2019-04-19 DIAGNOSIS — M21372 Foot drop, left foot: Secondary | ICD-10-CM | POA: Diagnosis not present

## 2019-04-20 DIAGNOSIS — I10 Essential (primary) hypertension: Secondary | ICD-10-CM | POA: Diagnosis not present

## 2019-04-20 DIAGNOSIS — S29011A Strain of muscle and tendon of front wall of thorax, initial encounter: Secondary | ICD-10-CM | POA: Diagnosis not present

## 2019-04-20 MED FILL — GABAPENTIN 300 MG CAPSULE: 300 | 30 days supply | Qty: 120 | Fill #1

## 2019-05-06 DIAGNOSIS — E1141 Type 2 diabetes mellitus with diabetic mononeuropathy: Secondary | ICD-10-CM | POA: Diagnosis not present

## 2019-05-06 DIAGNOSIS — R079 Chest pain, unspecified: Secondary | ICD-10-CM | POA: Diagnosis not present

## 2019-05-06 DIAGNOSIS — E1144 Type 2 diabetes mellitus with diabetic amyotrophy: Secondary | ICD-10-CM | POA: Diagnosis not present

## 2019-05-09 MED FILL — GABAPENTIN 300 MG CAPSULE: 300 | 30 days supply | Qty: 120 | Fill #1

## 2019-06-16 DIAGNOSIS — I10 Essential (primary) hypertension: Secondary | ICD-10-CM | POA: Diagnosis not present

## 2019-06-16 DIAGNOSIS — Z125 Encounter for screening for malignant neoplasm of prostate: Secondary | ICD-10-CM | POA: Diagnosis not present

## 2019-06-16 DIAGNOSIS — E1165 Type 2 diabetes mellitus with hyperglycemia: Secondary | ICD-10-CM | POA: Diagnosis not present

## 2019-06-16 DIAGNOSIS — E782 Mixed hyperlipidemia: Secondary | ICD-10-CM | POA: Diagnosis not present

## 2019-06-23 DIAGNOSIS — Z0000 Encounter for general adult medical examination without abnormal findings: Secondary | ICD-10-CM | POA: Diagnosis not present

## 2019-06-23 DIAGNOSIS — G5732 Lesion of lateral popliteal nerve, left lower limb: Secondary | ICD-10-CM | POA: Diagnosis not present

## 2019-06-23 DIAGNOSIS — E1144 Type 2 diabetes mellitus with diabetic amyotrophy: Secondary | ICD-10-CM | POA: Diagnosis not present

## 2019-06-23 DIAGNOSIS — I10 Essential (primary) hypertension: Secondary | ICD-10-CM | POA: Diagnosis not present

## 2019-06-23 DIAGNOSIS — E782 Mixed hyperlipidemia: Secondary | ICD-10-CM | POA: Diagnosis not present

## 2019-06-23 DIAGNOSIS — E1165 Type 2 diabetes mellitus with hyperglycemia: Secondary | ICD-10-CM | POA: Diagnosis not present

## 2019-07-12 DIAGNOSIS — Z1211 Encounter for screening for malignant neoplasm of colon: Secondary | ICD-10-CM | POA: Diagnosis not present

## 2019-08-31 IMAGING — MR MRI LUMBAR SPINE WITHOUT CONTRAST
4 of 5 series · 19 of 48 positions shown · non-contrast
Comparison: 07/05/2018

CLINICAL DATA: Back pain and leg pain. Symptoms worsened after
recent injection. Patient scheduled for surgery.

EXAM:
MRI LUMBAR SPINE WITHOUT CONTRAST
TECHNIQUE: Multiplanar, multisequence MR imaging of the lumbar spine was
performed. No intravenous contrast was administered.

[Series 3: T2 · sagittal · 4.0mm · 0.55mm/px · 6 of 15 slices shown (1 of 2)]
[im 1/15]
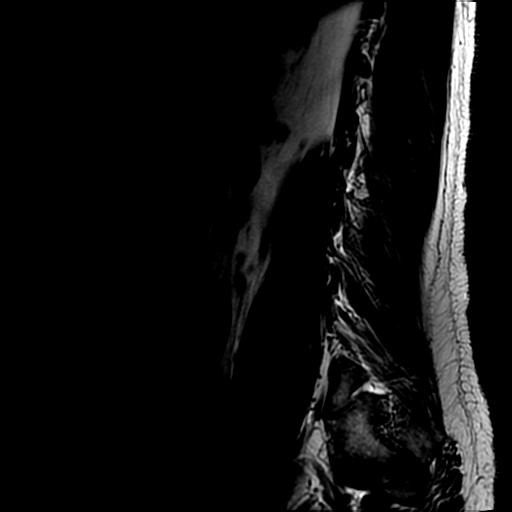
[im 3/15]
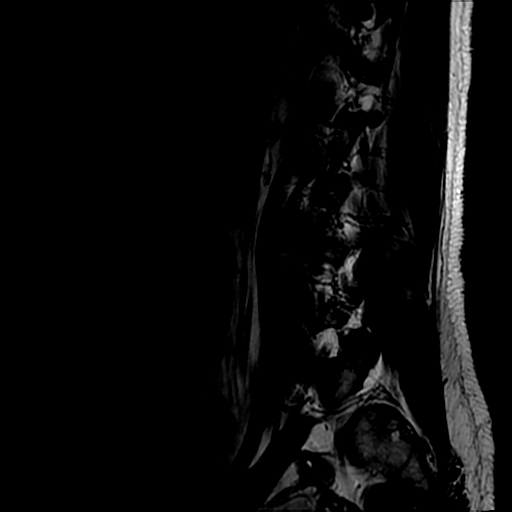
[im 6/15]
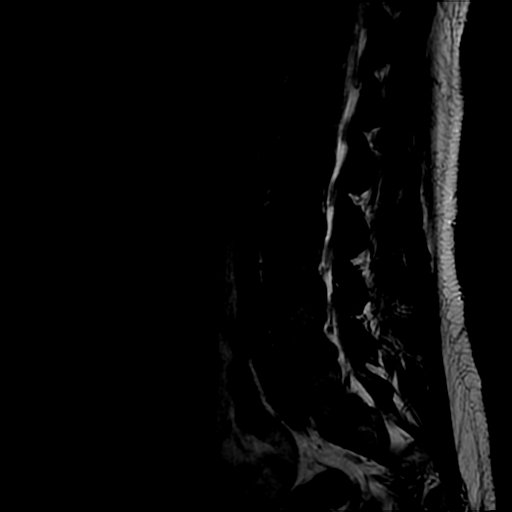
[im 9/15]
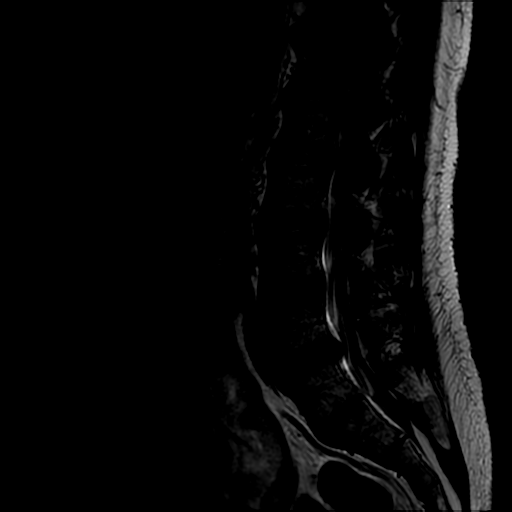
[im 12/15]
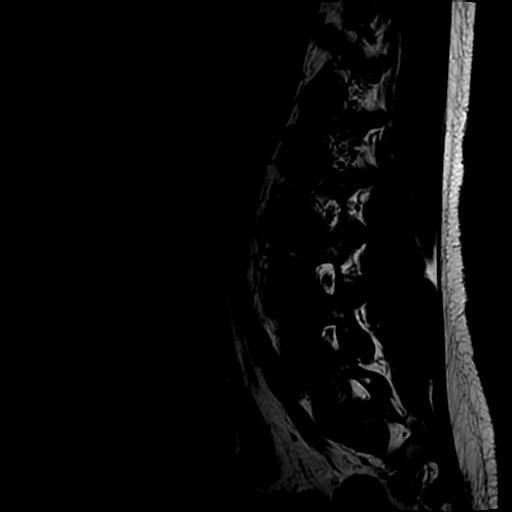
[im 15/15]
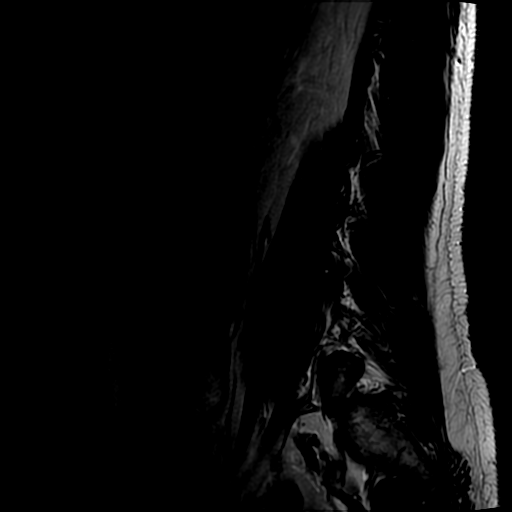

[Series 5: T1 · sagittal · 4.0mm · 0.55mm/px · 3 of 15 slices shown (1 of 2)]
[im 3/15]
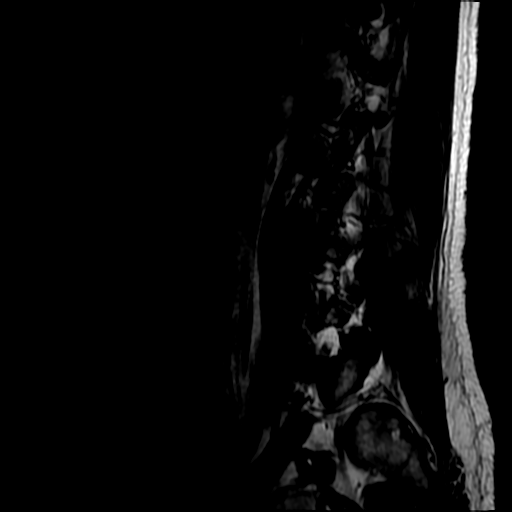
[im 9/15]
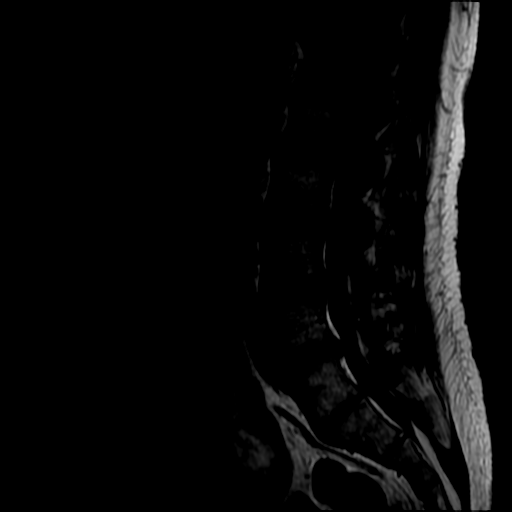
[im 15/15]
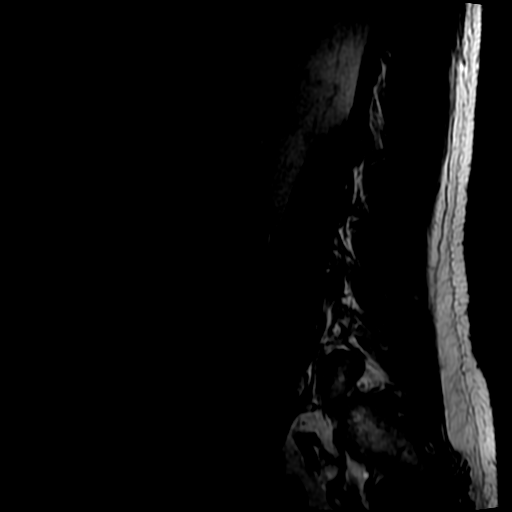

[Series 6: T2 · axial · 4.0mm · 0.39mm/px · z∈[-226,-77]mm · 7 of 35 slices shown (2 of 2)]
[im 1/35]
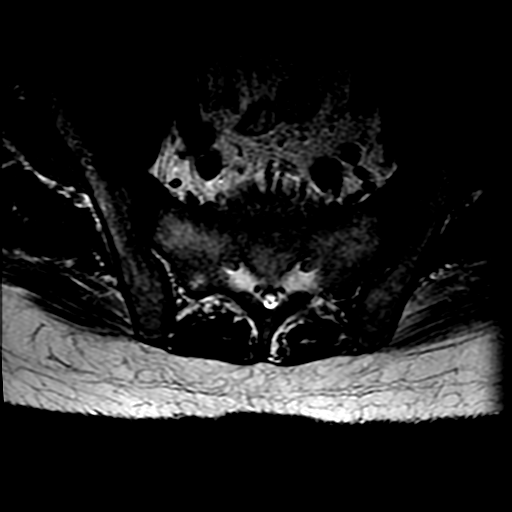
[im 5/35]
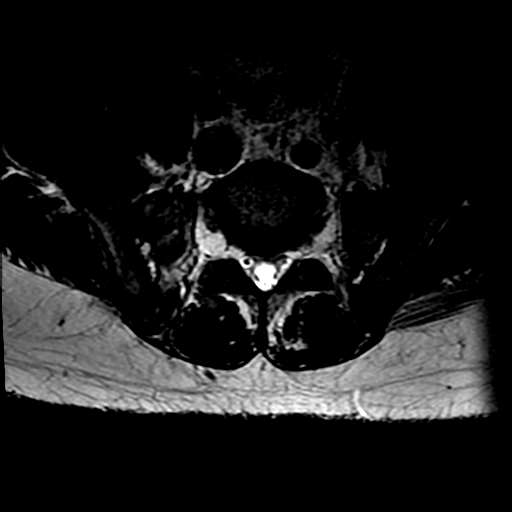
[im 10/35]
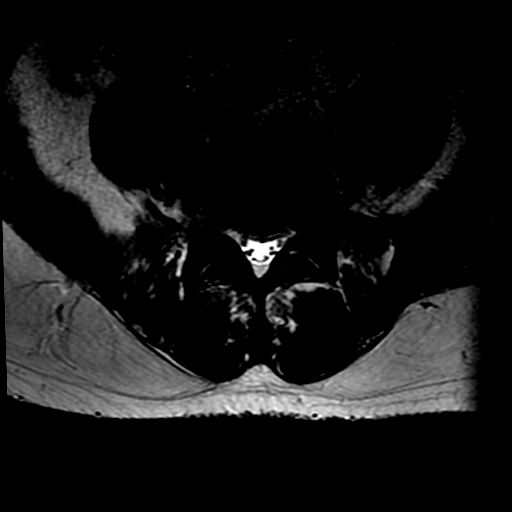
[im 15/35]
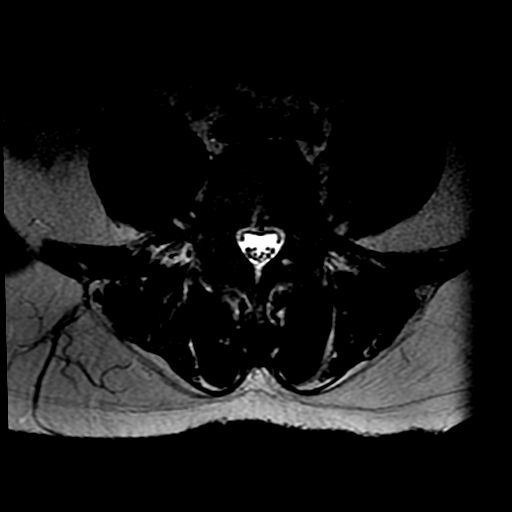
[im 18/35]
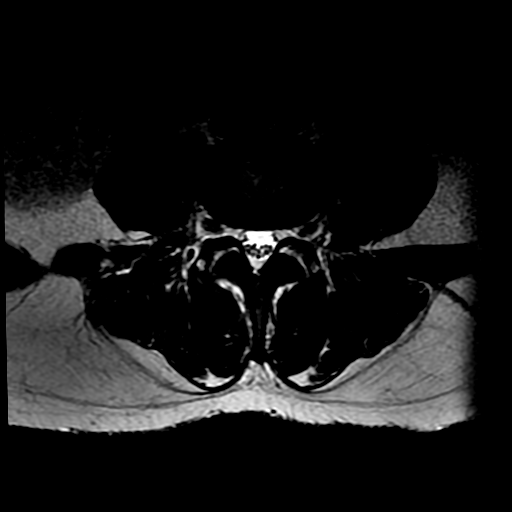
[im 20/35]
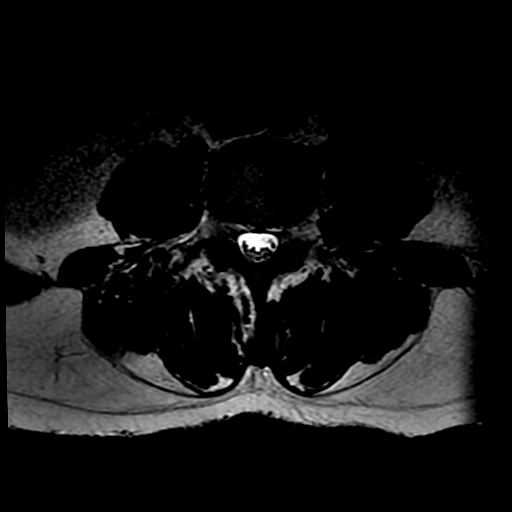
[im 30/35]
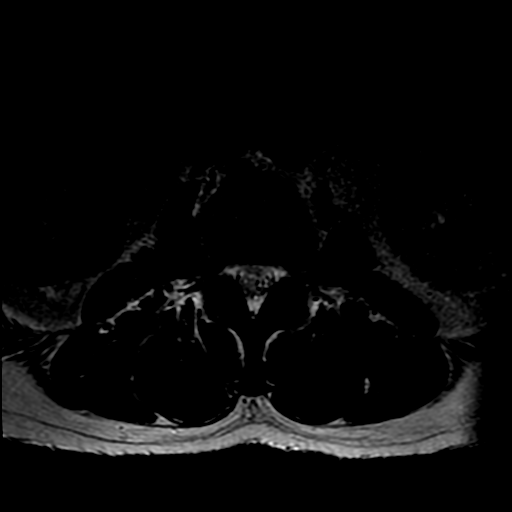

[Series 7: T1 · axial · 4.0mm · 0.39mm/px · z∈[-207,-77]mm · 3 of 35 slices shown (2 of 2)]
[im 5/35]
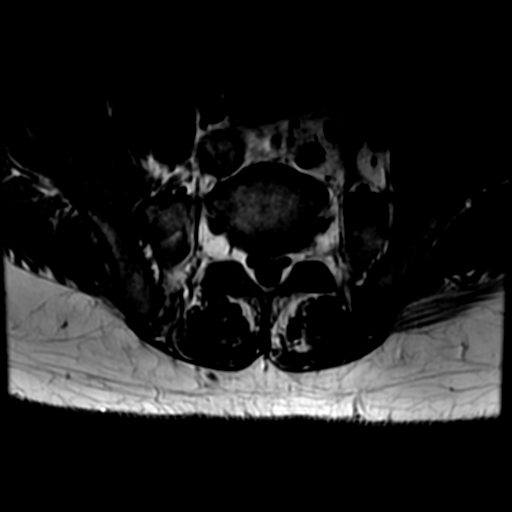
[im 18/35]
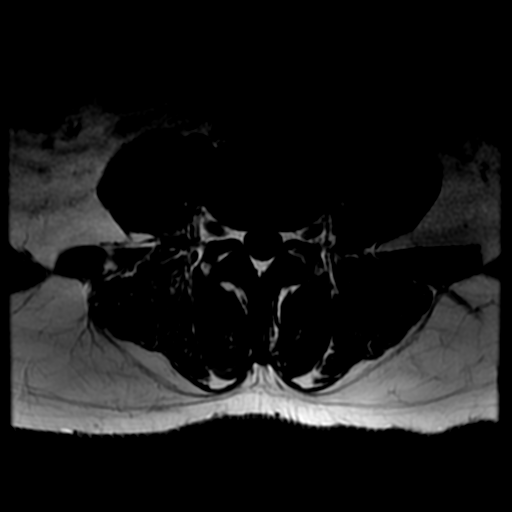
[im 30/35]
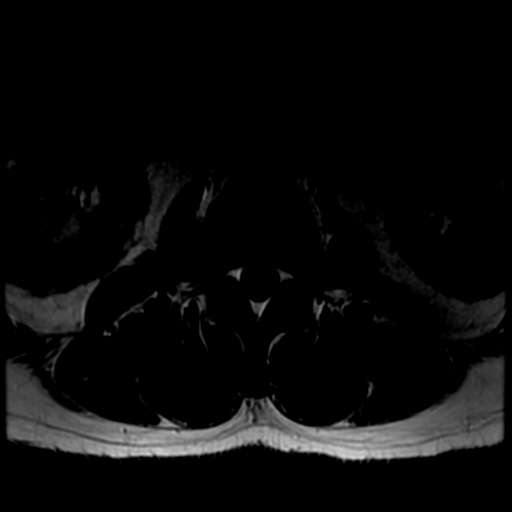

[19 of 48 positions shown; findings below may reference images not displayed]

FINDINGS: Segmentation: S1 is labeled as a transitional vertebra, consistent
with the previous study.

Alignment:  Normal

Vertebrae:  Normal

Conus medullaris and cauda equina: Conus extends to the L1-2 level.
Conus and cauda equina appear normal.

Paraspinal and other soft tissues: Normal

Disc levels:

No abnormality at L4-5 or above.

L5-S1 disc degeneration with broad-based disc herniation that
indents the thecal sac slightly and could be symptomatic. No change
since the previous study.

No sign injection complication.
IMPRESSION: No change since the study [DATE] is a transitional vertebra.
Broad-based disc herniation L5-S1 which could be symptomatic. No
sign of complication from recent injection.

## 2020-03-22 ENCOUNTER — Ambulatory Visit: Payer: Self-pay | Attending: Internal Medicine

## 2020-03-22 DIAGNOSIS — Z23 Encounter for immunization: Secondary | ICD-10-CM

## 2020-03-22 NOTE — Progress Notes (Signed)
   Covid-19 Vaccination Clinic  Name:  William Moran    MRN: [id number] DOB: 02-13-1971  03/22/2020  William Moran was observed post Covid-19 immunization for 15 minutes without incident. He was provided with Vaccine Information Sheet and instruction to access the V-Safe system.   William Moran was instructed to call 911 with any severe reactions post vaccine: Marland Kitchen Difficulty breathing  . Swelling of face and throat  . A fast heartbeat  . A bad rash all over body  . Dizziness and weakness   Immunizations Administered    Name Date Dose VIS Date Route   Pfizer COVID-19 Vaccine 03/22/2020  1:31 PM 0.3 mL 02/08/2020 Intramuscular   Manufacturer: [employer], [employer]   Lot: [id number]   NDC: [address]-6

## 2020-08-13 DIAGNOSIS — Z0271 Encounter for disability determination: Secondary | ICD-10-CM

## 2022-08-22 ENCOUNTER — Ambulatory Visit
Admission: RE | Admit: 2022-08-22 | Discharge: 2022-08-22 | Disposition: A | Discharge status: Home / Self Care | Payer: Self-pay | Source: Ambulatory Visit | Attending: Internal Medicine | Admitting: Internal Medicine

## 2022-08-22 VITALS — BP 164/93 | HR 102 | Temp 98.3°F | Resp 16

## 2022-08-22 DIAGNOSIS — L84 Corns and callosities: Secondary | ICD-10-CM

## 2022-08-22 MED ORDER — CEPHALEXIN 500 MG PO CAPS: 500.0000 mg | ORAL_CAPSULE | Freq: Four times a day (QID) | ORAL | 0 refills | Status: DC

## 2022-08-22 NOTE — ED Triage Notes (Signed)
Patient c/o a growth/raised area tot he left big toe x 3 weeks.

## 2022-08-22 NOTE — Discharge Instructions (Signed)
You have callus on your toe.  I am mildly concerned for infection so an antibiotic has been prescribed.  You will need to see podiatry.  I have placed a referral to podiatry.  If they do not call you, please call them at provided contact information to schedule an appointment yourself.  I have attached Instructions on how to care for calluses.  Ensure that you are not walking barefoot and wearing appropriate shoes as we discussed.

## 2022-08-22 NOTE — ED Provider Notes (Signed)
[facility]    CSN: [id number] Arrival date & time: 08/22/22  1044      History   Chief Complaint No chief complaint on file.   HPI William Moran is a 52 y.o. male.   Patient presents with a growth to the left great toe that he noticed about 3 weeks ago.  Denies any injury to the area.  Reports that he does a lot of walking at work but he does wear tennis shoes.  Reports some bloody drainage at times from the area.  Patient does have elevated blood pressure reading but denies history of hypertension or ever having to take medications for it.  Patient not reporting any chest pain, shortness of breath, headache, dizziness, blurred vision, nausea, vomiting.     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/15/2019   Diabetic neuropathy (HCC) 01/15/2019   Acute pain of right knee 01/11/2019   Trochanteric bursitis of right hip 01/11/2019   Hypertension    Hyperlipidemia    Diabetes mellitus without complication (HCC)    GERD (gastroesophageal reflux disease)    Vitamin D deficiency    Encounter for long-term (current) use of other medications     Past Surgical History:  Procedure Laterality Date   ANKLE SURGERY Right 2009   LUMBAR LAMINECTOMY & DISCECTOMY  08/27/2018   OPEN REDUCTION INTERNAL FIXATION (ORIF) METACARPAL Left 05/25/2014   Procedure: OPEN REDUCTION INTERNAL FIXATION  (ORIF) LEFT RING FINGER METACARPAL;  Surgeon: Cindee Salt, MD;  Location: [facility];  Service: Orthopedics;  Laterality: Left;       Home Medications    Prior to Admission medications   Medication Sig Start Date End Date Taking? Authorizing Provider  cephALEXin (KEFLEX) 500 MG capsule Take 1 capsule (500 mg total) by mouth 4 (four) times daily for 5 days. 08/22/22 08/27/22 Yes Estelita Iten, Acie Fredrickson, FNP  metFORMIN (GLUCOPHAGE) 500 MG tablet Take 500 mg by mouth 4 (four) times daily. Pt takes 2 in the morning and 2 in the evening    [provider]  PRESCRIPTION MEDICATION Short acting insulin 6 units into the skin three times daily with meals.  Long acting insulin 20 units into the skin every night.    [provider]    Family History Family History  Problem Relation Age of Onset   Diabetes Mother    High blood pressure Mother     Social History Social History   Tobacco Use   Smoking status: Never   Smokeless tobacco: Never  Vaping Use   Vaping Use: Never used  Substance Use Topics   Alcohol use: Yes    Alcohol/week: 3.0 - 4.0 standard drinks of alcohol    Types: 3 - 4 Cans of beer per week   Drug use: No     Allergies   Patient has  no known allergies.   Review of Systems Review of Systems Per HPI  Physical Exam Triage Vital Signs ED Triage Vitals  Enc Vitals Group     BP 08/22/22 1107 (!) 164/93     Pulse Rate 08/22/22 1107 (!) 102     Resp 08/22/22 1107 16     Temp 08/22/22 1107 98.3 F (36.8 C)     Temp Source 08/22/22 1107 Oral     SpO2 08/22/22 1107 96 %     Weight --      Height --      Head Circumference --      Peak Flow --      Pain Score 08/22/22 1142 0     Pain Loc --      Pain Edu? --      Excl. in GC? --    No data found.  Updated Vital Signs BP (!) 164/93 (BP Location: Right Arm)   Pulse (!) 102   Temp 98.3 F (36.8 C) (Oral)   Resp 16   SpO2 96%   Visual Acuity Right Eye Distance:   Left Eye  Distance:   Bilateral Distance:    Right Eye Near:   Left Eye Near:    Bilateral Near:     Physical Exam Constitutional:      General: He is not in acute distress.    Appearance: Normal appearance. He is not toxic-appearing or diaphoretic.  HENT:     Head: Normocephalic and atraumatic.  Eyes:     Extraocular Movements: Extraocular movements intact.     Conjunctiva/sclera: Conjunctivae normal.  Pulmonary:     Effort: Pulmonary effort is normal.  Musculoskeletal:       Feet:  Feet:     Comments: Patient has large callus to bottom and medial left great toe.  No obvious drainage noted but there is some mild surrounding swelling.  No discoloration.  Patient can wiggle toe.  Capillary refill and pulses intact. Neurological:     General: No focal deficit present.     Mental Status: He is alert and oriented to person, place, and time. Mental status is at baseline.  Psychiatric:        Mood and Affect: Mood normal.        Behavior: Behavior normal.        Thought Content: Thought content normal.        Judgment: Judgment normal.      UC Treatments / Results  Labs (all labs ordered are listed, but only abnormal results are displayed) Labs Reviewed - No data to display  EKG   Radiology No results found.  Procedures Procedures (including critical care time)  Medications Ordered in UC Medications - No data to display  Initial Impression / Assessment and Plan / UC Course  I have reviewed the triage vital signs and the nursing notes.  Pertinent labs & imaging results that were available during my care of the patient were reviewed by me and considered in my medical decision making (see chart for details).     Patient has callus to left great toe.  Mild concern for infection given patient has had drainage and there is some mild swelling surrounding so will treat with cephalexin antibiotic.  Advised supportive care for callus including not walking barefoot and wearing  appropriate shoes.  Patient will need to see podiatry so ambulatory referral was placed for patient.  Advised patient that if they do not call him to schedule appointment, he  is to call them himself at provided contact information.  Blood pressure is elevated.  Advised to monitor blood pressure closely at home and follow-up with urgent care or family medicine if it remains elevated.  He appears asymptomatic regarding blood pressure so do not think that emergent evaluation is necessary.  Heart rate appears baseline so no further workup for this at this time.  Advised strict follow-up precautions.  Patient verbalized understanding and was agreeable with plan.  Final Clinical Impressions(s) / UC Diagnoses   Final diagnoses:  Callus of toe     Discharge Instructions      You have callus on your toe.  I am mildly concerned for infection so an antibiotic has been prescribed.  You will need to see podiatry.  I have placed a referral to podiatry.  If they do not call you, please call them at provided contact information to schedule an appointment yourself.  I have attached Instructions on how to care for calluses.  Ensure that you are not walking barefoot and wearing appropriate shoes as we discussed.    ED Prescriptions     Medication Sig Dispense Auth. Provider   cephALEXin (KEFLEX) 500 MG capsule Take 1 capsule (500 mg total) by mouth 4 (four) times daily for 5 days. 20 capsule Gustavus Bryant, [place]      PDMP not reviewed this encounter.   Gustavus Bryant, [place] 08/22/22 1150

## 2022-08-25 ENCOUNTER — Ambulatory Visit (INDEPENDENT_AMBULATORY_CARE_PROVIDER_SITE_OTHER): Payer: Self-pay | Admitting: Podiatry

## 2022-08-25 ENCOUNTER — Ambulatory Visit (INDEPENDENT_AMBULATORY_CARE_PROVIDER_SITE_OTHER): Payer: Self-pay

## 2022-08-25 DIAGNOSIS — L97522 Non-pressure chronic ulcer of other part of left foot with fat layer exposed: Secondary | ICD-10-CM

## 2022-08-25 MED ORDER — SILVER SULFADIAZINE 1 % EX CREA: 1.0000 | TOPICAL_CREAM | Freq: Every day | CUTANEOUS | 0 refills | Status: DC

## 2022-08-25 MED ORDER — CEPHALEXIN 500 MG PO CAPS: 500.0000 mg | ORAL_CAPSULE | Freq: Four times a day (QID) | ORAL | 0 refills | Status: AC

## 2022-08-25 NOTE — Progress Notes (Unsigned)
Subjective:   Patient ID: William Moran, male   DOB: 52 y.o.   MRN: [id number]   HPI Chief Complaint  Patient presents with   Diabetic Ulcer    self pay- patient aware NP (reestablish patient) - Diabetic Wound L Hallux   52 year old male presents the office for above concerns.  He recent was in urgent care for the callus on his left foot.  He does have a history of dropping left send he wears a brace for this.  Over the last year developed the calluses gotten worse more recently.  He does not report any drainage or pus.  He was started on Keflex by the emergency room for concerns of infection of the area over the drainage.  He does not report any fevers or chills.  Just started antibitoics on sat. No fevers or chills  He is not sure of his A1c has an appt in 1 month.  Possible 9.2 or 9.5 last check   Review of Systems  All other systems reviewed and are negative.  Past Medical History:  Diagnosis Date   Diabetes mellitus without complication (HCC)    on Metformin and insulin   Encounter for long-term (current) use of other medications    GERD (gastroesophageal reflux disease)    pt states this is monitored and no medication has been prescribed    Hyperlipidemia    pt states this is monitored and no medication has been prescribed    Hypertension    pt states this is monitored and no medication has been prescribed    Vitamin D deficiency     Past Surgical History:  Procedure Laterality Date   ANKLE SURGERY Right 2009   LUMBAR LAMINECTOMY & DISCECTOMY  08/27/2018   OPEN REDUCTION INTERNAL FIXATION (ORIF) METACARPAL Left 05/25/2014   Procedure: OPEN REDUCTION INTERNAL FIXATION (ORIF) LEFT RING FINGER METACARPAL;  Surgeon: Cindee Salt, MD;  Location: [facility];  Service: Orthopedics;  Laterality: Left;     Current Outpatient Medications:    silver sulfADIAZINE (SILVADENE) 1 % cream, Apply 1 Application topically daily., Disp: 50 g, Rfl: 0   cephALEXin  (KEFLEX) 500 MG capsule, Take 1 capsule (500 mg total) by mouth 4 (four) times daily for 5 days., Disp: 20 capsule, Rfl: 0   metFORMIN (GLUCOPHAGE) 500 MG tablet, Take 500 mg by mouth 4 (four) times daily. Pt takes 2 in the morning and 2 in the evening, Disp: , Rfl:    PRESCRIPTION MEDICATION, Short acting insulin 6 units into the skin three times daily with meals.  Long acting insulin 20 units into the skin every night., Disp: , Rfl:   No Known Allergies         Objective:  Physical Exam  General: AAO x3, NAD  Dermatological: Thick hyperkeratotic tissue left hallux which was present on the picture below.  After debridement there is superficial granular wound present without any probing, amount or tunneling.  There is some malodor present with macerated tissue.  There is no surrounding erythema, ascending cellulitis.  No fluctuance or crepitation there is no malodor.  Vascular: Dorsalis Pedis artery and Posterior Tibial artery pedal pulses are palpable bilateral with immedate capillary fill time. There is no pain with calf compression, swelling, warmth, erythema.   Neruologic: Grossly intact via light touch bilateral.  Sensation decreased with Semmes Weinstein monofilament  Musculoskeletal: No gross boney pedal deformities bilateral. No pain, crepitus, or limitation noted with foot and ankle range of motion bilateral. Muscular strength 5/5  in all groups tested bilateral.  Gait: Unassisted, Nonantalgic.       Assessment:   Ulceration left hallux     Plan:  -Treatment options discussed including all alternatives, risks, and complications -Etiology of symptoms were discussed -X-rays were obtained and reviewed with the patient.  3 views left foot were obtained.  Increased soft tissue density noted on the medial hallux IPJ.  No definitive evidence of acute fracture or cortical changes to suggest osteomyelitis at this time. -Medically necessary wound debridement is performed.  Sharply  debrided the thick hyperkeratotic tissue with #[id number] with scalpel to reveal a superficial granular wound present underneath the callus.  I recommended a small antibiotic ointment dressing changes daily.  Prescribed Silvadene.  I also refilled Keflex.  Offloading. Monitor for any clinical signs or symptoms of infection and directed to call the office immediately should any occur or go to the ER.  Return in about 2 weeks (around 09/08/2022) for left toe ulcer.  Vivi Barrack DPM

## 2022-09-08 ENCOUNTER — Ambulatory Visit: Payer: Self-pay | Admitting: Podiatry

## 2022-10-24 ENCOUNTER — Ambulatory Visit
Admission: RE | Admit: 2022-10-24 | Discharge: 2022-10-24 | Disposition: A | Discharge status: Home / Self Care | Payer: Self-pay | Source: Ambulatory Visit

## 2022-10-24 VITALS — BP 161/82 | HR 108 | Temp 98.1°F | Resp 16

## 2022-10-24 DIAGNOSIS — K2990 Gastroduodenitis, unspecified, without bleeding: Secondary | ICD-10-CM

## 2022-10-24 DIAGNOSIS — K2970 Gastritis, unspecified, without bleeding: Secondary | ICD-10-CM

## 2022-10-24 MED ORDER — LIDOCAINE VISCOUS HCL 2 % MT SOLN
15.0000 mL | Freq: Once | OROMUCOSAL | Status: AC
  Administered 2022-10-24: 15 mL via OROMUCOSAL

## 2022-10-24 MED ORDER — ALUM & MAG HYDROXIDE-SIMETH 200-200-20 MG/5ML PO SUSP
30.0000 mL | Freq: Once | ORAL | Status: AC
  Administered 2022-10-24: 30 mL via ORAL

## 2022-10-24 NOTE — Discharge Instructions (Signed)
Drink plenty of fluids.  Avoid alcohol  Mylanta is discomfort.  Go to the Emergency Department if symptoms worsen or change

## 2022-10-24 NOTE — ED Provider Notes (Signed)
[facility]    CSN: [id number] Arrival date & time: 10/24/22  1000      History   Chief Complaint Chief Complaint  Patient presents with   Rib Injury    HPI William Moran is a 52 y.o. male.   Patient complains of pain in his right ribs.  Patient denies any cough he denies any congestion he has not had any shortness of breath.  Patient has had reflux in the plaque past.  Patient reports that he thinks he may have drank too much last night.  Patient denies any injuries.  He has not had any fever or chills.  The history is provided by the patient. No language interpreter was used.    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/15/2019   Diabetic neuropathy (HCC) 01/15/2019   Acute pain of right knee 01/11/2019   Trochanteric bursitis of right hip 01/11/2019   Hypertension    Hyperlipidemia    Diabetes mellitus without complication (HCC)    GERD (gastroesophageal reflux disease)    Vitamin D deficiency    Encounter for long-term (current) use of other medications     Past Surgical History:  Procedure Laterality Date   ANKLE SURGERY Right 2009   LUMBAR LAMINECTOMY & DISCECTOMY  08/27/2018   OPEN REDUCTION INTERNAL FIXATION (ORIF) METACARPAL Left 05/25/2014   Procedure: OPEN REDUCTION INTERNAL FIXATION (ORIF) LEFT RING FINGER METACARPAL;  Surgeon: Cindee Salt, MD;  Location:  [facility];  Service: Orthopedics;  Laterality: Left;       Home Medications    Prior to Admission medications   Medication Sig Start Date End Date Taking? Authorizing Provider  Insulin Zinc Human (NOVOLIN L [place]) as directed subcutaneous 15 to 18 units in am and 10 units in pm before dinner for 30 days 02/08/20  Yes [provider]  metFORMIN (GLUCOPHAGE) 500 MG tablet Take 500 mg by mouth 4 (four) times daily. Pt takes 2 in the morning and 2 in the evening    [provider]  metFORMIN (GLUCOPHAGE-XR) 500 MG 24 hr tablet 2 tablets Orally Twice a day for 30 days    [provider]  PRESCRIPTION MEDICATION Short acting insulin 6 units into the skin three times daily with meals.  Long acting insulin 20 units into the skin every night.    [provider]  silver sulfADIAZINE (SILVADENE) 1 % cream Apply 1 Application topically daily. 08/25/22   Vivi Barrack, DPM    Family History Family History  Problem Relation Age of Onset   Diabetes Mother    High blood pressure Mother     Social History Social History   Tobacco Use   Smoking status: Never   Smokeless tobacco: Never  Vaping Use   Vaping Use: Never used  Substance Use Topics   Alcohol use:  Yes    Alcohol/week: 3.0 - 4.0 standard drinks of alcohol    Types: 3 - 4 Cans of beer per week   Drug use: No     Allergies   Patient has no known allergies.   Review of Systems Review of Systems  All other systems reviewed and are negative.    Physical Exam Triage Vital Signs ED Triage Vitals  Enc Vitals Group     BP 10/24/22 1022 (!) 167/86     Pulse Rate 10/24/22 1022 (S) (!) 126     Resp 10/24/22 1022 16     Temp 10/24/22 1022 98.1 F (36.7 C)     Temp Source 10/24/22 1022 Oral     SpO2 10/24/22 1022 95 %     Weight --      Height --      Head Circumference --      Peak Flow --      Pain Score 10/24/22 1021 0     Pain Loc --      Pain Edu? --      Excl. in  GC? --    No data found.  Updated Vital Signs BP (!) 161/82 (BP Location: Right Arm)   Pulse (!) 108   Temp 98.1 F (36.7 C) (Oral)   Resp 16   SpO2 97%   Visual Acuity Right Eye Distance:   Left Eye Distance:   Bilateral Distance:    Right Eye Near:   Left Eye Near:    Bilateral Near:     Physical Exam Vitals and nursing note reviewed.  Constitutional:      Appearance: He is well-developed.  HENT:     Head: Normocephalic.  Cardiovascular:     Rate and Rhythm: Normal rate.  Pulmonary:     Effort: Pulmonary effort is normal.  Abdominal:     General: There is no distension.  Musculoskeletal:        General: Normal range of motion.     Cervical back: Normal range of motion.  Neurological:     General: No focal deficit present.     Mental Status: He is alert and oriented to person, place, and time.      UC Treatments / Results  Labs (all labs ordered are listed, but only abnormal results are displayed) Labs Reviewed - No data to display  EKG   Radiology No results found.  Procedures Procedures (including critical care time)  Medications Ordered in UC Medications  alum & mag hydroxide-simeth (MAALOX/MYLANTA) 200-200-20 MG/5ML suspension 30 mL (30 mLs Oral Given 10/24/22 1038)  lidocaine (XYLOCAINE) 2 % viscous mouth solution 15 mL (15 mLs Mouth/Throat Given 10/24/22 1038)    Initial Impression / Assessment and Plan / UC Course  I have reviewed the triage vital signs and the nursing notes.  Pertinent labs & imaging results that were available during my care of the patient were reviewed by me and considered in my medical decision making (see chart for details).     MDM I obtained an EKG on patient due to his having pain with no injury EKG shows a sinus tachycardia at 129.  Patient reports feeling anxious while having EKG dip done patient is allowed to relax EKG is repeated and is 114.  Patient is given a GI cocktail.  He reports he thinks this made him feel  better.  Patient is encouraged to drink oral fluids he is advised to avoid alcohol he should go to the emergency department  if symptoms become worse or change. Final Clinical Impressions(s) / UC Diagnoses   Final diagnoses:  Gastritis and gastroduodenitis     Discharge Instructions      Drink plenty of fluids.  Avoid alcohol  Mylanta is discomfort.  Go to the Emergency Department if symptoms worsen or change    ED Prescriptions   None    PDMP not reviewed this encounter. An After Visit Summary was printed and given to the patient.       Elson Areas, [place] 10/24/22 1320

## 2022-10-24 NOTE — ED Triage Notes (Signed)
Patient presents to [facility] for intermittent rib pain x 4-5 days ago. 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 No OTC pain meds.   Denies SOB, respiratory symptoms.

## 2022-11-05 ENCOUNTER — Ambulatory Visit: Payer: Self-pay | Admitting: Physician Assistant

## 2022-11-05 ENCOUNTER — Ambulatory Visit: Payer: Self-pay | Admitting: *Deleted

## 2022-11-05 ENCOUNTER — Encounter: Payer: Self-pay | Admitting: Physician Assistant

## 2022-11-05 VITALS — BP 169/102 | HR 105 | Ht 68.0 in | Wt 181.0 lb

## 2022-11-05 DIAGNOSIS — Z7984 Long term (current) use of oral hypoglycemic drugs: Secondary | ICD-10-CM

## 2022-11-05 DIAGNOSIS — E7849 Other hyperlipidemia: Secondary | ICD-10-CM

## 2022-11-05 DIAGNOSIS — Z125 Encounter for screening for malignant neoplasm of prostate: Secondary | ICD-10-CM

## 2022-11-05 DIAGNOSIS — E559 Vitamin D deficiency, unspecified: Secondary | ICD-10-CM

## 2022-11-05 DIAGNOSIS — R000 Tachycardia, unspecified: Secondary | ICD-10-CM

## 2022-11-05 DIAGNOSIS — E1165 Type 2 diabetes mellitus with hyperglycemia: Secondary | ICD-10-CM

## 2022-11-05 DIAGNOSIS — T148XXA Other injury of unspecified body region, initial encounter: Secondary | ICD-10-CM

## 2022-11-05 DIAGNOSIS — I10 Essential (primary) hypertension: Secondary | ICD-10-CM

## 2022-11-05 LAB — POCT GLYCOSYLATED HEMOGLOBIN (HGB A1C): Hemoglobin A1C: 13.7 % — AB (ref 4.0–5.6)

## 2022-11-05 MED ORDER — CYCLOBENZAPRINE HCL 5 MG PO TABS
5.0000 mg | ORAL_TABLET | Freq: Three times a day (TID) | ORAL | 0 refills | Status: DC | PRN
Start: 2022-11-05 — End: 2023-06-15

## 2022-11-05 MED ORDER — METFORMIN HCL ER 500 MG PO TB24: 1000.0000 mg | ORAL_TABLET | Freq: Two times a day (BID) | ORAL | 1 refills | Status: DC

## 2022-11-05 MED ORDER — LISINOPRIL 10 MG PO TABS
10.0000 mg | ORAL_TABLET | Freq: Every day | ORAL | 1 refills | Status: DC
Start: 2022-11-05 — End: 2023-06-15

## 2022-11-05 MED ORDER — GLIPIZIDE 5 MG PO TABS
5.0000 mg | ORAL_TABLET | Freq: Two times a day (BID) | ORAL | 3 refills | Status: DC
Start: 2022-11-05 — End: 2023-06-15

## 2022-11-05 NOTE — Telephone Encounter (Signed)
  Chief Complaint: left flank pain  Symptoms: left flank pain , under ribs. Comes and goes. Worse at times. Passing a lot of gas Frequency: worsening today  Pertinent Negatives: Patient denies chet pain no difficulty breathing in. No fever no nausea or vomiting  Disposition: [] ED /[] Urgent Care (no appt availability in office) / [] Appointment(In office/virtual)/ []  [facility]/ [] Home Care/ [] Refused Recommended Disposition /[x] [place]/ []  Follow-up with PCP Additional Notes:   Recommended mobile bust and gave address for today and tomorrow. Advised to set up appt to establish care with PCP. Patient reports he is awaiting insurance for new job to start and then he will set up with new provider.    Reason for Disposition  Diabetes mellitus or weak immune system (e.g., HIV positive, cancer chemo, splenectomy, organ transplant, chronic steroids)  (Exception: Mild pain that is only present with movement.)  Answer Assessment - Initial Assessment Questions 1. LOCATION: "Where does it hurt?" (e.g., left, right)     Left side under ribs  2. ONSET: "When did the pain start?"     Worsening today  3. SEVERITY: "How bad is the pain?" (e.g., Scale 1-10; mild, moderate, or severe)   - MILD (1-3): doesn't interfere with normal activities    - MODERATE (4-7): interferes with normal activities or awakens from sleep    - SEVERE (8-10): excruciating pain and patient unable to do normal activities (stays in bed)       Sharp pain comes and goes able to sleep  5 level   4. PATTERN: "Does the pain come and go, or is it constant?"      Comes and goes  5. CAUSE: "What do you think is causing the pain?"     Not sure  6. OTHER SYMPTOMS:  "Do you have any other symptoms?" (e.g., fever, abdomen pain, vomiting, leg weakness, burning with urination, blood in urine)     Left side flank pain under rib cage , passing gas, sharp pain level 5 now.  7. PREGNANCY:  "Is there any chance you are  pregnant?" "When was your last menstrual period?"     na  Protocols used: Flank Pain-A-AH

## 2022-11-05 NOTE — Progress Notes (Signed)
New Patient Office Visit  Subjective    Patient ID: William Moran, male    DOB: Dec 02, 1970  Age: 52 y.o. MRN: [id number]  CC:  Chief Complaint  Patient presents with   Abdominal Pain    Left side, x1 week     HPI William Moran states that he has been having intermittent sharp pains side for the past week.  States that occurs with movement, last a "split second".  States that he recently was started back pain out of the gym and feels he may have overdid some exercises.  States that he has not tried anything for relief.  States that he started a new job but unfortunately his insurance papers were messed up so he is unable to have insurance until November.  States that he is unable to return to his previous primary care provider.  States that he was previously taking his metformin and 20 units of Novolin.  States that he does not want to insulin injections anymore would prefer to take oral medications.  States that he does check his blood glucose levels at home, states that they have been elevated, 251 300 average.   States that he does check his blood pressure at home on occasion.  States that his readings are generally elevated, similar to today's.     Outpatient Encounter Medications as of 11/05/2022  Medication Sig   cyclobenzaprine (FLEXERIL) 5 MG tablet Take 1 tablet (5 mg total) by mouth 3 (three) times daily as needed for muscle spasms.   glipiZIDE (GLUCOTROL) 5 MG tablet Take 1 tablet (5 mg total) by mouth 2 (two) times daily before a meal.   lisinopril (ZESTRIL) 10 MG tablet Take 1 tablet (10 mg total) by mouth daily.   [DISCONTINUED] Insulin Zinc Human (NOVOLIN L [place]) as directed subcutaneous 15 to 18 units in am and 10 units in pm before dinner for 30 days   [DISCONTINUED] metFORMIN (GLUCOPHAGE) 500 MG tablet Take 500 mg by mouth 4 (four) times daily. Pt takes 2 in the morning and 2 in the evening   [DISCONTINUED] metFORMIN (GLUCOPHAGE-XR) 500 MG 24 hr tablet 2  tablets Orally Twice a day for 30 days   metFORMIN (GLUCOPHAGE-XR) 500 MG 24 hr tablet Take 2 tablets (1,000 mg total) by mouth 2 (two) times daily with a meal.   [DISCONTINUED] PRESCRIPTION MEDICATION Short acting insulin 6 units into the skin three times daily with meals.  Long acting insulin 20 units into the skin every night.   [DISCONTINUED] silver sulfADIAZINE (SILVADENE) 1 % cream Apply 1 Application topically daily. (Patient not taking: Reported on 11/05/2022)   No facility-administered encounter medications on file as of 11/05/2022.    Past Medical History:  Diagnosis Date   Diabetes mellitus without complication (HCC)    on Metformin and insulin   Encounter for long-term (current) use of other medications    GERD (gastroesophageal reflux disease)    pt states this is monitored and no medication has been prescribed    Hyperlipidemia    pt states this is monitored and no medication has been prescribed    Hypertension    pt states this is monitored and no medication has been prescribed    Vitamin D deficiency     Past Surgical History:  Procedure Laterality Date   ANKLE SURGERY Right 2009   LUMBAR LAMINECTOMY & DISCECTOMY  08/27/2018   OPEN REDUCTION INTERNAL FIXATION (ORIF) METACARPAL Left 05/25/2014   Procedure: OPEN REDUCTION INTERNAL FIXATION (ORIF) LEFT RING  FINGER METACARPAL;  Surgeon: Cindee Salt, MD;  Location: [facility];  Service: Orthopedics;  Laterality: Left;    Family History  Problem Relation Age of Onset   Diabetes Mother    High blood pressure Mother     Social History   Socioeconomic History   Marital status: Single    Spouse name: Not on file   Number of children: 0   Years of education: Not on file   Highest education level: Some college, no degree  Occupational History   Not on file  Tobacco Use   Smoking status: Never   Smokeless tobacco: Never  Vaping Use   Vaping status: Never Used  Substance and Sexual Activity   Alcohol  use: Yes    Alcohol/week: 3.0 - 4.0 standard drinks of alcohol    Types: 3 - 4 Cans of beer per week   Drug use: No   Sexual activity: Not on file  Other Topics Concern   Not on file  Social History Narrative   His mother lives with him   Left handed   Social Determinants of Health   Financial Resource Strain: Not on file  Food Insecurity: Not on file  Transportation Needs: Not on file  Physical Activity: Not on file  Stress: Not on file  Social Connections: Not on file  Intimate Partner Violence: Not on file    Review of Systems  Constitutional: Negative.   HENT: Negative.    Eyes: Negative.   Respiratory:  Negative for shortness of breath.   Cardiovascular:  Negative for chest pain and palpitations.  Gastrointestinal:  Positive for abdominal pain. Negative for constipation, diarrhea and nausea.  Genitourinary:  Negative for dysuria.  Musculoskeletal:  Positive for myalgias.  Skin: Negative.   Neurological: Negative.   Endo/Heme/Allergies: Negative.   Psychiatric/Behavioral: Negative.          Objective    BP (!) 169/102 (BP Location: Right Arm, Patient Position: Sitting, Cuff Size: Large)   Pulse (!) 105   Ht 5\' 8"  (1.727 m)   Wt 181 lb (82.1 kg)   SpO2 97%   BMI 27.52 kg/m   Physical Exam Vitals and nursing note reviewed.  Constitutional:      Appearance: Normal appearance.  HENT:     Head: Normocephalic and atraumatic.     Right Ear: External ear normal.     Left Ear: External ear normal.     Nose: Nose normal.     Mouth/Throat:     Mouth: Mucous membranes are moist.     Pharynx: Oropharynx is clear.  Eyes:     Extraocular Movements: Extraocular movements intact.     Conjunctiva/sclera: Conjunctivae normal.     Pupils: Pupils are equal, round, and reactive to light.  Cardiovascular:     Rate and Rhythm: Normal rate and regular rhythm.     Pulses: Normal pulses.     Heart sounds: Normal heart sounds.  Pulmonary:     Effort: Pulmonary effort is  normal.     Breath sounds: Normal breath sounds.  Abdominal:     General: Abdomen is flat. Bowel sounds are normal.     Tenderness: There is no abdominal tenderness.  Musculoskeletal:        General: Normal range of motion.     Cervical back: Normal range of motion.  Skin:    General: Skin is warm and dry.  Neurological:     General: No focal deficit present.  Mental Status: He is oriented to person, place, and time.  Psychiatric:        Mood and Affect: Mood normal.        Behavior: Behavior normal.        Thought Content: Thought content normal.        Judgment: Judgment normal.        Assessment & Plan:   Problem List Items Addressed This Visit       Cardiovascular and Mediastinum   Hypertension   Relevant Medications   lisinopril (ZESTRIL) 10 MG tablet   Other Relevant Orders   CBC with Differential/Platelet   Comp. Metabolic Panel (12)     Endocrine   Diabetes mellitus without complication (HCC)   Relevant Medications   metFORMIN (GLUCOPHAGE-XR) 500 MG 24 hr tablet   lisinopril (ZESTRIL) 10 MG tablet   glipiZIDE (GLUCOTROL) 5 MG tablet     Other   Hyperlipidemia   Relevant Medications   lisinopril (ZESTRIL) 10 MG tablet   Vitamin D deficiency   Relevant Orders   Vitamin D, 25-hydroxy   Other Visit Diagnoses     Muscle strain    -  Primary   Relevant Medications   cyclobenzaprine (FLEXERIL) 5 MG tablet   Screening PSA (prostate specific antigen)       Relevant Orders   PSA   Tachycardia       Relevant Orders   TSH   Long term current use of oral hypoglycemic drug          1. Muscle strain Trial Flexeril.  Patient education given on supportive care.  Red flags given for prompt reevaluation - cyclobenzaprine (FLEXERIL) 5 MG tablet; Take 1 tablet (5 mg total) by mouth 3 (three) times daily as needed for muscle spasms.  Dispense: 30 tablet; Refill: 0  2. Primary hypertension Trial lisinopril.  Patient encouraged to check blood pressure at  home, keep a written log and have available for all office visits.  Red flags given for prompt reevaluation - CBC with Differential/Platelet - Comp. Metabolic Panel (12) - lisinopril (ZESTRIL) 10 MG tablet; Take 1 tablet (10 mg total) by mouth daily.  Dispense: 30 tablet; Refill: 1  3. Type 2 diabetes mellitus with hyperglycemia, unspecified whether long term insulin use (HCC) Continue metformin.  Trial glipizide.  Patient last saw primary care provider in January 2023.  A1c 13.7.  Patient was given prescription of metformin at recent urgent care visit.  No recent medication refills prior to that.  Patient does not want to restart insulin injections at this time.  Patient encouraged to check blood glucose levels at home, keep a written log and have available for all office visits.  Red flags given for prompt reevaluation.  Patient given application for [place] financial assistance.  Patient will follow-up with the mobile unit in 4 weeks.  - Microalbumin / creatinine urine ratio - metFORMIN (GLUCOPHAGE-XR) 500 MG 24 hr tablet; Take 2 tablets (1,000 mg total) by mouth 2 (two) times daily with a meal.  Dispense: 120 tablet; Refill: 1 - glipiZIDE (GLUCOTROL) 5 MG tablet; Take 1 tablet (5 mg total) by mouth 2 (two) times daily before a meal.  Dispense: 60 tablet; Refill: 3  4. Vitamin D deficiency  - Vitamin D, 25-hydroxy  5. Other hyperlipidemia   6. Screening PSA (prostate specific antigen)  - PSA  7. Tachycardia  - TSH  8. Long term current use of oral hypoglycemic drug   I have reviewed the patient's  medical history (PMH, PSH, Social History, Family History, Medications, and allergies) , and have been updated if relevant. I spent 30 minutes reviewing chart and  face to face time with patient.    Return in about 4 weeks (around 12/03/2022) for With [facility].   Kasandra Knudsen Mayers, PA-C

## 2022-11-05 NOTE — Patient Instructions (Signed)
You are going to continue taking metformin 1000 mg twice a day and start taking glipizide 5 mg twice a day.  Please start checking your blood pressure in the morning while fasting, keeping a written log and having available for all office visits.   Your blood pressure is elevated, remember to start taking lisinopril 10 mg once daily.  Please check your blood pressure on a daily basis, keep a written log and have available for all office visits.  To help with your muscle strain, you can use Flexeril 5 mg every 8 hours as needed. Make sure that you are staying well-hydrated.  We will call you with today's lab results.  You will follow-up with the mobile unit in approximately 4 weeks.  Roney Jaffe, PA-C Physician Assistant [facility] [email or web address]   Muscle Strain A muscle strain is an injury that occurs when a muscle is stretched beyond its normal length. Usually, a small number of muscle fibers are torn when this happens. There are three types of muscle strains. First-degree strains have the least amount of muscle fiber tearing and the least amount of pain. Second-degree and third-degree strains have more tearing and pain. Usually, recovery from muscle strain takes 1-2 weeks. Complete healing normally takes 5-6 weeks. What are the causes? This condition is caused when a sudden, violent force is placed on a muscle and stretches it too far. This may occur with a fall, while lifting, or during sports. What increases the risk? This condition is more likely to develop in athletes and people who are physically active. What are the signs or symptoms? Symptoms of this condition include: Pain. Tenderness. Bruising. Swelling. Trouble using the muscle. How is this diagnosed? This condition is diagnosed based on a physical exam and your medical history. Tests may also be done, including an X-ray, ultrasound, or MRI. How is this  treated? This condition is initially treated with PRICE therapy. This therapy involves: Protecting the muscle from being injured again. Resting the injured muscle. Icing the injured muscle. Applying pressure (compression) to the injured muscle. This may be done with a splint or elastic bandage. Raising (elevating) the injured muscle. Your health care provider may also recommend medicine for pain. Follow these instructions at home: If you have a removable splint: Wear the splint as told by your health care provider. Remove it only as told by your health care provider. Check the skin around the splint every day. Tell your health care provider about any concerns. Loosen the splint if your fingers or toes tingle, become numb, or turn cold and blue. Keep the splint clean. If the splint is not waterproof: Do not let it get wet. Cover it with a watertight covering when you take a bath or a shower. Managing pain, stiffness, and swelling  If directed, put ice on the injured area. To do this: If you have a removable splint, remove it as told by your health care provider. Put ice in a plastic bag. Place a towel between your skin and the bag. Leave the ice on for 20 minutes, 2-3 times a day. Remove the ice if your skin turns bright red. This is very important. If you cannot feel pain, heat, or cold, you have a greater risk of damage to the area. Move your fingers or toes often to reduce stiffness and swelling. Raise (elevate) the injured area above the level of your heart while you are sitting or lying down. Wear an elastic bandage as told by your  health care provider. Make sure that it is not too tight. General instructions Take over-the-counter and prescription medicines only as told by your health care provider. Treatment may include muscle relaxants or medicines for pain and inflammation that are taken by mouth or applied to the skin. Restrict your activity and rest the injured muscle as told  by your health care provider. Gentle movements may be allowed. If physical therapy was prescribed, do exercises as told by your health care provider. Do not put pressure on any part of the splint until it is fully hardened. This may take several hours. Do not use any products that contain nicotine or tobacco. These products include cigarettes, chewing tobacco, and vaping devices, such as e-cigarettes. If you need help quitting, ask your health care provider. Ask your health care provider when it is safe to drive if you have a splint. Keep all follow-up visits. This is important. How is this prevented? Warm up before exercising. This helps to prevent future muscle strains. Contact a health care provider if: You have more pain or swelling in the injured area. Get help right away if: You have numbness or tingling in the injured area. You lose a lot of strength in the injured area. Summary A muscle strain is an injury that occurs when a muscle is stretched beyond its normal length. This condition is caused when a sudden, violent force is placed on a muscle and stretches it too far. This condition is initially treated with PRICE therapy, which involves protecting, resting, icing, compressing, and elevating. Gentle movements may be allowed. If physical therapy was prescribed, do exercises as told by your health care provider. This information is not intended to replace advice given to you by your health care provider. Make sure you discuss any questions you have with your health care provider. Document Revised: 06/25/2020 Document Reviewed: 06/25/2020 Elsevier Patient Education  2024 [employer].

## 2022-11-06 LAB — COMP. METABOLIC PANEL (12)
AST: 15 IU/L (ref 0–40)
Albumin: 4.4 g/dL (ref 3.8–4.9)
Alkaline Phosphatase: 80 IU/L (ref 44–121)
BUN/Creatinine Ratio: 14 (ref 9–20)
BUN: 13 mg/dL (ref 6–24)
Bilirubin Total: 0.4 mg/dL (ref 0.0–1.2)
Calcium: 10.1 mg/dL (ref 8.7–10.2)
Chloride: 93 mmol/L — ABNORMAL LOW (ref 96–106)
Creatinine, Ser: 0.92 mg/dL (ref 0.76–1.27)
Globulin, Total: 3.2 g/dL (ref 1.5–4.5)
Glucose: 448 mg/dL — ABNORMAL HIGH (ref 70–99)
Potassium: 4.5 mmol/L (ref 3.5–5.2)
Sodium: 134 mmol/L (ref 134–144)
Total Protein: 7.6 g/dL (ref 6.0–8.5)
eGFR: 99 mL/min/{1.73_m2} (ref >59)

## 2022-11-06 LAB — CBC WITH DIFFERENTIAL/PLATELET
Basophils Absolute: 0.1 10*3/uL (ref 0.0–0.2)
Basos: 1 %
EOS (ABSOLUTE): 0 10*3/uL (ref 0.0–0.4)
Eos: 0 %
Hematocrit: 43.6 % (ref 37.5–51.0)
Hemoglobin: 13.8 g/dL (ref 13.0–17.7)
Immature Grans (Abs): 0 10*3/uL (ref 0.0–0.1)
Immature Granulocytes: 0 %
Lymphocytes Absolute: 2.2 10*3/uL (ref 0.7–3.1)
Lymphs: 25 %
MCH: 26.8 pg (ref 26.6–33.0)
MCHC: 31.7 g/dL (ref 31.5–35.7)
MCV: 85 fL (ref 79–97)
Monocytes Absolute: 0.5 10*3/uL (ref 0.1–0.9)
Monocytes: 5 %
Neutrophils Absolute: 5.9 10*3/uL (ref 1.4–7.0)
Neutrophils: 69 %
Platelets: 259 10*3/uL (ref 150–450)
RBC: 5.15 x10E6/uL (ref 4.14–5.80)
RDW: 12.6 % (ref 11.6–15.4)
WBC: 8.6 10*3/uL (ref 3.4–10.8)

## 2022-11-06 LAB — TSH: TSH: 0.928 u[IU]/mL (ref 0.450–4.500)

## 2022-11-06 LAB — MICROALBUMIN / CREATININE URINE RATIO
Creatinine, Urine: 38.8 mg/dL
Microalb/Creat Ratio: 78 mg/g creat — ABNORMAL HIGH (ref 0–29)
Microalbumin, Urine: 30.1 ug/mL

## 2022-11-06 LAB — VITAMIN D 25 HYDROXY (VIT D DEFICIENCY, FRACTURES): Vit D, 25-Hydroxy: 25.7 ng/mL — ABNORMAL LOW (ref 30.0–100.0)

## 2022-11-06 LAB — PSA: Prostate Specific Ag, Serum: 1.9 ng/mL (ref 0.0–4.0)

## 2022-11-27 ENCOUNTER — Other Ambulatory Visit: Payer: Self-pay | Admitting: Physician Assistant

## 2022-11-27 DIAGNOSIS — I10 Essential (primary) hypertension: Secondary | ICD-10-CM

## 2022-12-01 ENCOUNTER — Telehealth: Payer: Self-pay

## 2022-12-01 NOTE — Telephone Encounter (Signed)
Contacted pt to inform him of locations of mobile bus. Pt stated he will come in and complete his follow up on 8/14

## 2023-01-14 ENCOUNTER — Other Ambulatory Visit: Payer: Self-pay | Admitting: Podiatry

## 2023-01-14 ENCOUNTER — Other Ambulatory Visit: Payer: Self-pay | Admitting: Physician Assistant

## 2023-01-14 DIAGNOSIS — E1165 Type 2 diabetes mellitus with hyperglycemia: Secondary | ICD-10-CM

## 2023-06-10 ENCOUNTER — Ambulatory Visit: Payer: Self-pay | Admitting: Family Medicine

## 2023-06-15 ENCOUNTER — Encounter: Payer: Self-pay | Admitting: Family Medicine

## 2023-06-15 ENCOUNTER — Ambulatory Visit: Payer: Self-pay

## 2023-06-15 ENCOUNTER — Emergency Department (HOSPITAL_BASED_OUTPATIENT_CLINIC_OR_DEPARTMENT_OTHER)
Admission: EM | Admit: 2023-06-15 | Discharge: 2023-06-16 | Disposition: A | Discharge status: Home / Self Care | Payer: BC Managed Care – PPO | Attending: Emergency Medicine | Admitting: Emergency Medicine

## 2023-06-15 ENCOUNTER — Ambulatory Visit: Payer: BC Managed Care – PPO | Admitting: Family Medicine

## 2023-06-15 ENCOUNTER — Other Ambulatory Visit: Payer: Self-pay

## 2023-06-15 VITALS — BP 158/80 | HR 112 | Temp 99.0°F | Ht 68.0 in | Wt 187.0 lb

## 2023-06-15 DIAGNOSIS — I10 Essential (primary) hypertension: Secondary | ICD-10-CM | POA: Diagnosis not present

## 2023-06-15 DIAGNOSIS — Z794 Long term (current) use of insulin: Secondary | ICD-10-CM

## 2023-06-15 DIAGNOSIS — E1165 Type 2 diabetes mellitus with hyperglycemia: Secondary | ICD-10-CM | POA: Insufficient documentation

## 2023-06-15 DIAGNOSIS — E1144 Type 2 diabetes mellitus with diabetic amyotrophy: Secondary | ICD-10-CM

## 2023-06-15 DIAGNOSIS — E1344 Other specified diabetes mellitus with diabetic amyotrophy: Secondary | ICD-10-CM

## 2023-06-15 DIAGNOSIS — Z7984 Long term (current) use of oral hypoglycemic drugs: Secondary | ICD-10-CM | POA: Insufficient documentation

## 2023-06-15 DIAGNOSIS — E860 Dehydration: Secondary | ICD-10-CM | POA: Insufficient documentation

## 2023-06-15 DIAGNOSIS — R739 Hyperglycemia, unspecified: Secondary | ICD-10-CM

## 2023-06-15 DIAGNOSIS — R011 Cardiac murmur, unspecified: Secondary | ICD-10-CM

## 2023-06-15 DIAGNOSIS — R000 Tachycardia, unspecified: Secondary | ICD-10-CM

## 2023-06-15 LAB — URINALYSIS, ROUTINE W REFLEX MICROSCOPIC
Bacteria, UA: NONE SEEN
Bilirubin Urine: NEGATIVE
Glucose, UA: >1000 mg/dL — AB
Hgb urine dipstick: NEGATIVE
Leukocytes,Ua: NEGATIVE
Nitrite: NEGATIVE
Protein, ur: NEGATIVE mg/dL
Specific Gravity, Urine: 1.036 — ABNORMAL HIGH (ref 1.005–1.030)
pH: 5.5 (ref 5.0–8.0)

## 2023-06-15 LAB — COMPREHENSIVE METABOLIC PANEL
ALT: 29 U/L (ref 0–53)
AST: 21 U/L (ref 0–37)
Albumin: 4.2 g/dL (ref 3.5–5.2)
Alkaline Phosphatase: 82 U/L (ref 39–117)
BUN: 17 mg/dL (ref 6–23)
CO2: 26 meq/L (ref 19–32)
Calcium: 9.2 mg/dL (ref 8.4–10.5)
Chloride: 93 meq/L — ABNORMAL LOW (ref 96–112)
Creatinine, Ser: 1.03 mg/dL (ref 0.40–1.50)
GFR: 82.07 mL/min (ref >60.00)
Glucose, Bld: 654 mg/dL (ref 70–99)
Potassium: 4.6 meq/L (ref 3.5–5.1)
Sodium: 130 meq/L — ABNORMAL LOW (ref 135–145)
Total Bilirubin: 0.3 mg/dL (ref 0.2–1.2)
Total Protein: 7.8 g/dL (ref 6.0–8.3)

## 2023-06-15 LAB — CBC WITH DIFFERENTIAL/PLATELET
Basophils Absolute: 0.1 10*3/uL (ref 0.0–0.1)
Basophils Relative: 0.7 % (ref 0.0–3.0)
Eosinophils Absolute: 0.1 10*3/uL (ref 0.0–0.7)
Eosinophils Relative: 1.8 % (ref 0.0–5.0)
HCT: 40.2 % (ref 39.0–52.0)
Hemoglobin: 13.3 g/dL (ref 13.0–17.0)
Lymphocytes Relative: 30.3 % (ref 12.0–46.0)
Lymphs Abs: 2.4 10*3/uL (ref 0.7–4.0)
MCHC: 33.2 g/dL (ref 30.0–36.0)
MCV: 88.3 fl (ref 78.0–100.0)
Monocytes Absolute: 0.5 10*3/uL (ref 0.1–1.0)
Monocytes Relative: 6.7 % (ref 3.0–12.0)
Neutro Abs: 4.8 10*3/uL (ref 1.4–7.7)
Neutrophils Relative %: 60.5 % (ref 43.0–77.0)
Platelets: 286 10*3/uL (ref 150.0–400.0)
RBC: 4.55 Mil/uL (ref 4.22–5.81)
RDW: 13.4 % (ref 11.5–15.5)
WBC: 7.9 10*3/uL (ref 4.0–10.5)

## 2023-06-15 LAB — BASIC METABOLIC PANEL
Anion gap: 10 (ref 5–15)
BUN: 17 mg/dL (ref 6–20)
CO2: 27 mmol/L (ref 22–32)
Calcium: 9.8 mg/dL (ref 8.9–10.3)
Chloride: 92 mmol/L — ABNORMAL LOW (ref 98–111)
Creatinine, Ser: 1.02 mg/dL (ref 0.61–1.24)
GFR, Estimated: >60 mL/min (ref >60)
Glucose, Bld: 542 mg/dL (ref 70–99)
Potassium: 4.2 mmol/L (ref 3.5–5.1)
Sodium: 129 mmol/L — ABNORMAL LOW (ref 135–145)

## 2023-06-15 LAB — I-STAT VENOUS BLOOD GAS, ED
Acid-Base Excess: 2 mmol/L (ref 0.0–2.0)
Bicarbonate: 28.7 mmol/L — ABNORMAL HIGH (ref 20.0–28.0)
Calcium, Ion: 1.22 mmol/L (ref 1.15–1.40)
HCT: 42 % (ref 39.0–52.0)
Hemoglobin: 14.3 g/dL (ref 13.0–17.0)
O2 Saturation: 65 %
Potassium: 4.3 mmol/L (ref 3.5–5.1)
Sodium: 132 mmol/L — ABNORMAL LOW (ref 135–145)
TCO2: 30 mmol/L (ref 22–32)
pCO2, Ven: 52.4 mmHg (ref 44–60)
pH, Ven: 7.347 (ref 7.25–7.43)
pO2, Ven: 36 mmHg (ref 32–45)

## 2023-06-15 LAB — HEMOGLOBIN A1C: Hgb A1c MFr Bld: 15.9 % — ABNORMAL HIGH (ref 4.6–6.5)

## 2023-06-15 LAB — CBC
HCT: 41.5 % (ref 39.0–52.0)
Hemoglobin: 13.8 g/dL (ref 13.0–17.0)
MCH: 28.3 pg (ref 26.0–34.0)
MCHC: 33.3 g/dL (ref 30.0–36.0)
MCV: 85 fL (ref 80.0–100.0)
Platelets: 292 10*3/uL (ref 150–400)
RBC: 4.88 MIL/uL (ref 4.22–5.81)
RDW: 12.9 % (ref 11.5–15.5)
WBC: 8.2 10*3/uL (ref 4.0–10.5)
nRBC: 0 % (ref 0.0–0.2)

## 2023-06-15 LAB — CBG MONITORING, ED
Glucose-Capillary: 451 mg/dL — ABNORMAL HIGH (ref 70–99)
Glucose-Capillary: 389 mg/dL — ABNORMAL HIGH (ref 70–99)

## 2023-06-15 MED ORDER — FREESTYLE LIBRE 3 PLUS SENSOR MISC: 1.0000 | 12 refills | Status: AC

## 2023-06-15 MED ORDER — METFORMIN HCL 500 MG PO TABS
1000.0000 mg | ORAL_TABLET | Freq: Once | ORAL | Status: AC
  Administered 2023-06-15: 1000 mg via ORAL
  Filled 2023-06-15: qty 2

## 2023-06-15 MED ORDER — GLIPIZIDE 5 MG PO TABS: 5.0000 mg | ORAL_TABLET | Freq: Two times a day (BID) | ORAL | 1 refills | Status: DC

## 2023-06-15 MED ORDER — METFORMIN HCL ER 500 MG PO TB24
1000.0000 mg | ORAL_TABLET | Freq: Two times a day (BID) | ORAL | 1 refills | Status: DC
Start: 2023-06-15 — End: 2023-09-25

## 2023-06-15 MED ORDER — LISINOPRIL 10 MG PO TABS: 10.0000 mg | ORAL_TABLET | Freq: Every day | ORAL | 1 refills | Status: DC

## 2023-06-15 MED ORDER — SODIUM CHLORIDE 0.9 % IV BOLUS
1000.0000 mL | Freq: Once | INTRAVENOUS | Status: AC
  Administered 2023-06-15: 1000 mL via INTRAVENOUS

## 2023-06-15 MED ORDER — SODIUM CHLORIDE 0.9 % IV BOLUS
500.0000 mL | Freq: Once | INTRAVENOUS | Status: AC
  Administered 2023-06-15: 500 mL via INTRAVENOUS

## 2023-06-15 MED ORDER — BLOOD GLUCOSE TEST VI STRP
1.0000 | ORAL_STRIP | Freq: Three times a day (TID) | 0 refills | Status: AC
Start: 2023-06-15 — End: 2023-07-15

## 2023-06-15 MED ORDER — LANCET DEVICE MISC
1.0000 | Freq: Three times a day (TID) | 0 refills | Status: AC
Start: 2023-06-15 — End: 2023-07-15

## 2023-06-15 MED ORDER — BLOOD GLUCOSE MONITORING SUPPL DEVI
1.0000 | Freq: Three times a day (TID) | 0 refills | Status: AC
Start: 2023-06-15 — End: ?

## 2023-06-15 MED ORDER — LANCETS MISC. MISC
1.0000 | Freq: Three times a day (TID) | 0 refills | Status: AC
Start: 2023-06-15 — End: 2023-07-15

## 2023-06-15 NOTE — Discharge Instructions (Addendum)
 You have been seen and discharged from the emergency department.  You were found to have elevated blood sugar but no other complications.  You were given IV fluids.  As well as your nighttime metformin dose.  Is extremely important to stay compliant with medications, continually check your blood sugar and follow-up with your primary doctor.  Follow-up with your primary provider for further evaluation and further care. Take home medications as prescribed. If you have any worsening symptoms or further concerns for your health please return to an emergency department for further evaluation.

## 2023-06-15 NOTE — Patient Instructions (Addendum)
 Welcome to [facility]!  We are checking labs today, will be in contact with any results that require further attention.  I have refilled your lisinopril for you to take 1 tablet once daily.  I have refilled your metformin for you to take 2 tablets twice a day with food.  I have also refilled your glipizide for you to take twice a day with food.  I have sent in a manual glucometer and have attached instructions with the video and how to use this.  I have also sent in a freestyle libre 3+ continuous glucose monitor sensor.  You should be able to download the app on your phone to have this read your blood sugar.  If you need any help with this, please feel free to schedule a nurse visit so we can show you how to use this.  I would like you to check your blood pressures at home.  Ideally, once a day.  Bring the readings with you when you come back in a month for your visit.   Follow up in about a month to re check BP and medication.

## 2023-06-15 NOTE — ED Provider Notes (Signed)
 [place] EMERGENCY DEPARTMENT AT [facility] Provider Note   CSN: [id number] Arrival date & time: 06/15/23  1942     History  Chief Complaint  Patient presents with   Hyperglycemia    William Moran is a 53 y.o. male.  HPI   53 year old male presents to the emergency department concern for hyperglycemia.  Patient is supposed to be on metformin and glipizide, he admits that he ran out of his medications and has not been on them since Friday.  He saw his primary doctor today and got prescriptions refilled but they noted that his blood sugar was over 600 cm for further evaluation.  He endorses some fatigue but no nausea/vomiting or polyuria.  He is otherwise been in his usual state of health.  Home Medications Prior to Admission medications   Medication Sig Start Date End Date Taking? Authorizing Provider  Blood Glucose Monitoring Suppl DEVI 1 each by Does not apply route in the morning, at noon, and at bedtime. May substitute to any manufacturer covered by patient's insurance. 06/15/23   Moshe Cipro, FNP  Continuous Glucose Sensor (FREESTYLE LIBRE 3 PLUS SENSOR) MISC Inject 1 each into the skin continuous for 14 days. Change sensor every 15 days. 06/15/23 06/29/23  Moshe Cipro, FNP  glipiZIDE (GLUCOTROL) 5 MG tablet Take 1 tablet (5 mg total) by mouth 2 (two) times daily before a meal. 06/15/23   Moshe Cipro, FNP  Glucose Blood (BLOOD GLUCOSE TEST STRIPS) STRP 1 each by In Vitro route in the morning, at noon, and at bedtime. May substitute to any manufacturer covered by patient's insurance. 06/15/23 07/15/23  Moshe Cipro, FNP  Lancet Device MISC 1 each by Does not apply route in the morning, at noon, and at bedtime. May substitute to any manufacturer covered by patient's insurance. 06/15/23 07/15/23  Moshe Cipro, FNP  Lancets Misc. MISC 1 each by Does not apply route in the morning, at noon, and at bedtime. May substitute to any manufacturer  covered by patient's insurance. 06/15/23 07/15/23  Moshe Cipro, FNP  lisinopril (ZESTRIL) 10 MG tablet Take 1 tablet (10 mg total) by mouth daily. 06/15/23   Moshe Cipro, FNP  metFORMIN (GLUCOPHAGE-XR) 500 MG 24 hr tablet Take 2 tablets (1,000 mg total) by mouth 2 (two) times daily with a meal. 06/15/23   Moshe Cipro, FNP  silver sulfADIAZINE (SILVADENE) 1 % cream APPLY 1 APPLICATION TOPICALLY DAILY Patient not taking: Reported on 06/15/2023 01/14/23   Pilar Plate, DPM      Allergies    Patient has no known allergies.    Review of Systems   Review of Systems  Constitutional:  Positive for fatigue. Negative for fever.  Respiratory:  Negative for shortness of breath.   Cardiovascular:  Negative for chest pain.  Gastrointestinal:  Negative for abdominal pain, diarrhea and vomiting.  Endocrine: Negative for polydipsia and polyuria.  Genitourinary:  Negative for frequency.  Skin:  Negative for rash.  Neurological:  Negative for headaches.    Physical Exam Updated Vital Signs BP (!) 166/98   Pulse 90   Temp 98.9 F (37.2 C) (Oral)   Resp 18   Ht 5\' 8"  (1.727 m)   Wt 84.8 kg   SpO2 99%   BMI 28.43 kg/m  Physical Exam Vitals and nursing note reviewed.  Constitutional:      General: He is not in acute distress.    Appearance: Normal appearance. He is not ill-appearing.  HENT:     Head: Normocephalic.  Mouth/Throat:     Mouth: Mucous membranes are moist.  Cardiovascular:     Rate and Rhythm: Normal rate.  Pulmonary:     Effort: Pulmonary effort is normal. No respiratory distress.  Abdominal:     Palpations: Abdomen is soft.     Tenderness: There is no abdominal tenderness.  Skin:    General: Skin is warm.  Neurological:     Mental Status: He is alert and oriented to person, place, and time. Mental status is at baseline.  Psychiatric:        Mood and Affect: Mood normal.     ED Results / Procedures / Treatments   Labs (all labs  ordered are listed, but only abnormal results are displayed) Labs Reviewed  BASIC METABOLIC PANEL - Abnormal; Notable for the following components:      Result Value   Sodium 129 (*)    Chloride 92 (*)    Glucose, Bld 542 (*)    All other components within normal limits  URINALYSIS, ROUTINE W REFLEX MICROSCOPIC - Abnormal; Notable for the following components:   Color, Urine COLORLESS (*)    Specific Gravity, Urine 1.036 (*)    Glucose, UA >1,000 (*)    Ketones, ur TRACE (*)    All other components within normal limits  CBG MONITORING, ED - Abnormal; Notable for the following components:   Glucose-Capillary 451 (*)    All other components within normal limits  I-STAT VENOUS BLOOD GAS, ED - Abnormal; Notable for the following components:   Bicarbonate 28.7 (*)    Sodium 132 (*)    All other components within normal limits  CBG MONITORING, ED - Abnormal; Notable for the following components:   Glucose-Capillary 389 (*)    All other components within normal limits  CBC    EKG None  Radiology No results found.  Procedures Procedures    Medications Ordered in ED Medications  sodium chloride 0.9 % bolus 1,000 mL (0 mLs Intravenous Stopped 06/15/23 2256)  sodium chloride 0.9 % bolus 500 mL (500 mLs Intravenous New Bag/Given 06/15/23 2314)  metFORMIN (GLUCOPHAGE) tablet 1,000 mg (1,000 mg Oral Given 06/15/23 2312)    ED Course/ Medical Decision Making/ A&P                                 Medical Decision Making Amount and/or Complexity of Data Reviewed Labs: ordered.  Risk Prescription drug management.   53 year old male with past medical history of diabetes presents to the emergency department concern for hyperglycemia.  Patient admits that he is been out of medications for the past 4 days.  He endorses fatigue but no other acute symptoms.  Patient saw his primary doctor today, had his prescriptions refilled and they noted that his blood sugar was in 600 so he was  referred here for further evaluation.  Patient is tachycardic on arrival.  Denies any acute symptoms.  Blood work shows mild dehydration but no findings of DKA or HHS.  He has hyperglycemia in the 500s with pseudohyponatremia.  After liter of fluids the blood sugar has downtrended into the 300s.  We will give him his night dose of metformin, given additional small fluid bolus and plan for discharge and outpatient follow-up.  He is otherwise tolerating p.o. and has no complaints.  Patient at this time appears safe and stable for discharge and close outpatient follow up. Discharge plan and strict return to ED  precautions discussed, patient verbalizes understanding and agreement.        Final Clinical Impression(s) / ED Diagnoses Final diagnoses:  None    Rx / DC Orders ED Discharge Orders     None         Rozelle Logan, DO 06/15/23 2324

## 2023-06-15 NOTE — ED Triage Notes (Addendum)
 Pt POV from home, seen by PCP for checkup, advised to come to ED to blood sugar >600. No other complaints, takes metformin. CBC and CMP drawn at PCP

## 2023-06-15 NOTE — ED Notes (Signed)
I-Stat performed by RT.

## 2023-06-15 NOTE — Telephone Encounter (Signed)
 The on call provider, Dr. Sanda Linger, was contacted and given the following information.   Copied from CRM [id number]. Topic: Clinical - Lab/Test Results >> Jun 15, 2023  5:50 PM William Moran wrote: Reason for CRM: [facility] calling with critical lab results. Reason for Disposition  Health Information question, no triage required and triager able to answer question  Answer Assessment - Initial Assessment Questions 1. REASON FOR CALL or QUESTION: "What is your reason for calling today?" or "How can I best help you?" or "What question do you have that I can help answer?"     William Moran, an employee with [employer], called for a critical lab value for this patient. The patient's glucose was 654 today when collected at 2:33 PM.  Protocols used: Information Only Call - No Triage-A-AH

## 2023-06-15 NOTE — Progress Notes (Signed)
 New Patient Office Visit  Subjective    Patient ID: William Moran, male    DOB: [birth year]  Age: 53 y.o. MRN: [id number]  CC:  Chief Complaint  Patient presents with   Establish Care    Establish Care. Patient had back surgery in 2021 that resulted in foot drop in left leg. Interested in brace. Type 2 diabetic since 2012-2013 (checking sugar levels every 2-3 days with levels being in the 250s). Last taken metformin last Friday. Notes of high blood pressure but has not been regularly checking those levels    HPI  William Moran presents to establish care today. Has hx HTN and DM. Has not been taking medications as prescribed, has not had insurance to get medications filled.  Reports blood sugars have been running in the 250s on average. Does not have the ability to check blood pressures at home. Has previously used [employer], interested in another CGM. Denies any symptomatic hypo or hyperglycemic episodes. Denies any unintentional weight loss or gain recently. States he is working out at [employer] 5 days a week. He is not fasting today. Requesting refills of glipizide, lisinopril, metformin today. Denies other concerns today. Medical history as outlined below. Denies vaccines today.  Outpatient Encounter Medications as of 06/15/2023  Medication Sig   glipiZIDE (GLUCOTROL) 5 MG tablet Take 1 tablet (5 mg total) by mouth 2 (two) times daily before a meal.   lisinopril (ZESTRIL) 10 MG tablet Take 1 tablet (10 mg total) by mouth daily.   metFORMIN (GLUCOPHAGE-XR) 500 MG 24 hr tablet Take 2 tablets (1,000 mg total) by mouth 2 (two) times daily with a meal.   silver sulfADIAZINE (SILVADENE) 1 % cream APPLY 1 APPLICATION TOPICALLY DAILY (Patient not taking: Reported on 06/15/2023)   [DISCONTINUED] cyclobenzaprine (FLEXERIL) 5 MG tablet Take 1 tablet (5 mg total) by mouth 3 (three) times daily as needed for muscle spasms.   [DISCONTINUED] glipiZIDE (GLUCOTROL) 5 MG tablet  Take 1 tablet (5 mg total) by mouth 2 (two) times daily before a meal. (Patient not taking: Reported on 06/15/2023)   [DISCONTINUED] lisinopril (ZESTRIL) 10 MG tablet Take 1 tablet (10 mg total) by mouth daily. (Patient not taking: Reported on 06/15/2023)   [DISCONTINUED] metFORMIN (GLUCOPHAGE-XR) 500 MG 24 hr tablet Take 2 tablets (1,000 mg total) by mouth 2 (two) times daily with a meal. (Patient not taking: Reported on 06/15/2023)   No facility-administered encounter medications on file as of 06/15/2023.    Past Medical History:  Diagnosis Date   Diabetes mellitus without complication (HCC)    on Metformin and insulin   Encounter for long-term (current) use of other medications    GERD (gastroesophageal reflux disease)    pt states this is monitored and no medication has been prescribed    Hyperlipidemia    pt states this is monitored and no medication has been prescribed    Hypertension    pt states this is monitored and no medication has been prescribed    Vitamin D deficiency     Past Surgical History:  Procedure Laterality Date   ANKLE SURGERY Right 2009   LUMBAR LAMINECTOMY & DISCECTOMY  08/27/2018   OPEN REDUCTION INTERNAL FIXATION (ORIF) METACARPAL Left 05/25/2014   Procedure: OPEN REDUCTION INTERNAL FIXATION (ORIF) LEFT RING FINGER METACARPAL;  Surgeon: Cindee Salt, MD;  Location: [facility];  Service: Orthopedics;  Laterality: Left;    Family History  Problem Relation Age of Onset   Diabetes Mother  High blood pressure Mother     Social History   Socioeconomic History   Marital status: Single    Spouse name: Not on file   Number of children: 0   Years of education: Not on file   Highest education level: 12th grade  Occupational History   Not on file  Tobacco Use   Smoking status: Never   Smokeless tobacco: Never  Vaping Use   Vaping status: Never Used  Substance and Sexual Activity   Alcohol use: Yes    Alcohol/week: 3.0 - 4.0 standard drinks  of alcohol    Types: 3 - 4 Cans of beer per week   Drug use: No   Sexual activity: Not on file  Other Topics Concern   Not on file  Social History Narrative   His mother lives with him   Left handed   Social Drivers of Health   Financial Resource Strain: Low Risk  (06/14/2023)   Overall Financial Resource Strain (CARDIA)    Difficulty of Paying Living Expenses: Not very hard  Food Insecurity: No Food Insecurity (06/14/2023)   Hunger Vital Sign    Worried About Running Out of Food in the Last Year: Never true    Ran Out of Food in the Last Year: Never true  Transportation Needs: No Transportation Needs (06/14/2023)   PRAPARE - Administrator, Civil Service (Medical): No    Lack of Transportation (Non-Medical): No  Physical Activity: Sufficiently Active (06/14/2023)   Exercise Vital Sign    Days of Exercise per Week: 6 days    Minutes of Exercise per Session: 130 min  Stress: Stress Concern Present (06/14/2023)   [employer] of Occupational Health - Occupational Stress Questionnaire    Feeling of Stress : To some extent  Social Connections: Moderately Isolated (06/14/2023)   Social Connection and Isolation Panel [NHANES]    Frequency of Communication with Friends and Family: Three times a week    Frequency of Social Gatherings with Friends and Family: More than three times a week    Attends Religious Services: More than 4 times per year    Active Member of [employer] or Organizations: No    Attends Engineer, structural: Not on file    Marital Status: Never married  Catering manager Violence: Not on file    ROS Per HPI      Objective    BP (!) 158/80   Pulse (!) 112   Temp 99 F (37.2 C)   Ht 5\' 8"  (1.727 m)   Wt 187 lb (84.8 kg)   SpO2 99%   BMI 28.43 kg/m   Physical Exam Vitals and nursing note reviewed.  Constitutional:      General: He is not in acute distress.    Appearance: Normal appearance.  HENT:     Head: Normocephalic and  atraumatic.  Eyes:     Extraocular Movements: Extraocular movements intact.  Cardiovascular:     Rate and Rhythm: Regular rhythm. Tachycardia present.     Pulses: Normal pulses.     Heart sounds: Murmur heard.  Pulmonary:     Effort: Pulmonary effort is normal. No respiratory distress.     Breath sounds: Normal breath sounds. No wheezing, rhonchi or rales.  Musculoskeletal:        General: Normal range of motion.     Cervical back: Normal range of motion.  Lymphadenopathy:     Cervical: No cervical adenopathy.  Skin:    General: Skin  is warm and dry.  Neurological:     General: No focal deficit present.     Mental Status: He is alert and oriented to person, place, and time.  Psychiatric:        Mood and Affect: Mood normal.        Behavior: Behavior normal.        Assessment & Plan:   1. Type 2 diabetes mellitus with hyperglycemia, without long-term current use of insulin (HCC) (Primary)  - CBC with Differential/Platelet - Comprehensive metabolic panel - HgB A1c - glipiZIDE (GLUCOTROL) 5 MG tablet; Take 1 tablet (5 mg total) by mouth 2 (two) times daily before a meal.  Dispense: 180 tablet; Refill: 1 - metFORMIN (GLUCOPHAGE-XR) 500 MG 24 hr tablet; Take 2 tablets (1,000 mg total) by mouth 2 (two) times daily with a meal.  Dispense: 360 tablet; Refill: 1 - Continuous Glucose Sensor (FREESTYLE LIBRE 3 PLUS SENSOR) MISC; Inject 1 each into the skin continuous for 14 days. Change sensor every 15 days.  Dispense: 2 each; Refill: 12 - Blood Glucose Monitoring Suppl DEVI; 1 each by Does not apply route in the morning, at noon, and at bedtime. May substitute to any manufacturer covered by patient's insurance.  Dispense: 1 each; Refill: 0 - Glucose Blood (BLOOD GLUCOSE TEST STRIPS) STRP; 1 each by In Vitro route in the morning, at noon, and at bedtime. May substitute to any manufacturer covered by patient's insurance.  Dispense: 100 strip; Refill: 0 - Lancet Device MISC; 1 each by Does  not apply route in the morning, at noon, and at bedtime. May substitute to any manufacturer covered by patient's insurance.  Dispense: 1 each; Refill: 0 - Lancets Misc. MISC; 1 each by Does not apply route in the morning, at noon, and at bedtime. May substitute to any manufacturer covered by patient's insurance.  Dispense: 100 each; Refill: 0  2. 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  Dispense: 180 tablet; Refill: 1 - metFORMIN (GLUCOPHAGE-XR) 500 MG 24 hr tablet; Take 2 tablets (1,000 mg total) by mouth 2 (two) times daily with a meal.  Dispense: 360 tablet; Refill: 1 - Continuous Glucose Sensor (FREESTYLE LIBRE 3 PLUS SENSOR) MISC; Inject 1 each into the skin continuous for 14 days. Change sensor every 15 days.  Dispense: 2 each; Refill: 12 - Blood Glucose Monitoring Suppl DEVI; 1 each by Does not apply route in the morning, at noon, and at bedtime. May substitute to any manufacturer covered by patient's insurance.  Dispense: 1 each; Refill: 0 - Glucose Blood (BLOOD GLUCOSE TEST STRIPS) STRP; 1 each by In Vitro route in the morning, at noon, and at bedtime. May substitute to any manufacturer covered by patient's insurance.  Dispense: 100 strip; Refill: 0 - Lancet Device MISC; 1 each by Does not apply route in the morning, at noon, and at bedtime. May substitute to any manufacturer covered by patient's insurance.  Dispense: 1 each; Refill: 0 - Lancets Misc. MISC; 1 each by Does not apply route in the morning, at noon, and at bedtime. May substitute to any manufacturer covered by patient's insurance.  Dispense: 100 each; Refill: 0  3. Primary hypertension  - CBC with Differential/Platelet - Comprehensive metabolic panel - lisinopril (ZESTRIL) 10 MG tablet; Take 1 tablet (10 mg total) by mouth daily.  Dispense:  90 tablet; Refill: 1  4. Tachycardia  -Likely due to anxiety being in the office setting  5. Murmur, cardiac  -Will continue to monitor -Discussed when to seek more acute care   Return in about 4 weeks (around 07/13/2023).   Moshe Cipro, FNP

## 2023-06-15 NOTE — Progress Notes (Signed)
 Spoke with patient about dangerously elevated blood sugar. Discussed risk of DKA. Recommended that he go to the ER for eval and treatment. States that he will go to the ER. Still asymptomatic.

## 2023-06-15 NOTE — ED Notes (Signed)
 Pt has increase in urination... No excessive urine smell, burning... Pt has had vision changes... Increase in thirst..Marland Kitchen

## 2023-06-16 NOTE — ED Notes (Signed)
 RN reviewed discharge instructions with pt. Pt verbalized understanding and had no further questions. VSS upon discharge.

## 2023-06-29 ENCOUNTER — Other Ambulatory Visit (HOSPITAL_COMMUNITY): Payer: Self-pay

## 2023-07-02 ENCOUNTER — Ambulatory Visit: Payer: Self-pay | Admitting: Family Medicine

## 2023-07-02 ENCOUNTER — Other Ambulatory Visit: Payer: Self-pay

## 2023-07-02 ENCOUNTER — Ambulatory Visit
Admission: RE | Admit: 2023-07-02 | Discharge: 2023-07-02 | Disposition: A | Discharge status: Home / Self Care | Source: Ambulatory Visit | Attending: Physician Assistant | Admitting: Physician Assistant

## 2023-07-02 ENCOUNTER — Ambulatory Visit (INDEPENDENT_AMBULATORY_CARE_PROVIDER_SITE_OTHER)

## 2023-07-02 VITALS — BP 189/99 | HR 94 | Temp 99.0°F | Resp 18 | Wt 186.9 lb

## 2023-07-02 DIAGNOSIS — S8391XA Sprain of unspecified site of right knee, initial encounter: Secondary | ICD-10-CM | POA: Diagnosis not present

## 2023-07-02 DIAGNOSIS — M25561 Pain in right knee: Secondary | ICD-10-CM

## 2023-07-02 DIAGNOSIS — I10 Essential (primary) hypertension: Secondary | ICD-10-CM | POA: Diagnosis not present

## 2023-07-02 MED ORDER — DICLOFENAC SODIUM 1 % EX GEL: 2.0000 g | Freq: Four times a day (QID) | CUTANEOUS | 0 refills | Status: AC

## 2023-07-02 NOTE — ED Triage Notes (Signed)
 RT knee pain/ 10 days - Entered by patient  Pt states he has not had any injuries or falls. He states he walks a lot and usually doesn't have any issues with his knees. Painful when walking. Not painful to touch.

## 2023-07-02 NOTE — Discharge Instructions (Signed)
 I will contact you if the radiologist sees anything on your x-ray that changes our treatment plan.  Use the brace for comfort and support.  Keep your knee elevated and use ice for 15 minutes at a time 3-4 times per day.  Avoid strenuous activity including lots of walking.  Apply diclofenac up to 4 times a day.  Do not take NSAIDs (aspirin, ibuprofen/Advil, naproxen/Aleve).  You can use Tylenol as needed.  If your symptoms are not improving please follow-up with orthopedics; call to schedule an appointment.  If anything worsens please return for reevaluation.  Your blood pressure is very elevated.  Please monitor this at home.  Follow-up with your primary care.  Continue blood pressure medication.  If you develop any chest pain, shortness of breath, headache, vision change, dizziness in the setting of high blood pressure you need to go to the ER.

## 2023-07-02 NOTE — ED Provider Notes (Signed)
 [facility]    CSN: [id number] Arrival date & time: 07/02/23  1841      History   Chief Complaint Chief Complaint  Patient presents with   Knee Pain    RT knee pain/ 10 days - Entered by patient    HPI William Moran is a 53 y.o. male.   Patient presents today with a week and a half long history of right knee pain.  He denies any known injury, recent fall, trauma.  He does report that he started working out a few weeks ago but does not remember injuring himself.  He reports that pain is currently rated 4 on a 0 to pain scale but increases to 8 with attempted ambulation, localized to medial inferior right knee without radiation, described as aching with periodic sharp pain, no relieving factors identified.  He denies any redness, swelling, numbness or paresthesias in the foot.  He denies any fever, nausea, vomiting.  Denies previous injury or surgery involving his knee.  He has tried [facility] powder without improvement of symptoms.  He denies any history of gout or arthritis.  His blood pressure is very elevated.  He reports a history of whitecoat syndrome.  He was recently seen by his provider who started him on lisinopril 10 mg.  He reports his blood pressure is much more controlled at home.  He has been compliant with his medication.  Denies any headaches, dizziness, chest pain, shortness of breath.  He does not take NSAIDs or drink caffeine regularly.    Past Medical History:  Diagnosis Date   Diabetes mellitus without complication (HCC)    on Metformin and insulin   Encounter for long-term (current) use of other medications    GERD (gastroesophageal reflux disease)    pt states this is monitored and no medication has been prescribed    Hyperlipidemia    pt states this is monitored and no medication has been prescribed    Hypertension    pt states this is monitored and no medication has been prescribed    Vitamin D deficiency     Patient Active Problem List    Diagnosis Date Noted   Type 2 diabetes mellitus with hyperglycemia, without long-term current use of insulin (HCC) 06/15/2023   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/15/2019   Diabetic neuropathy (HCC) 01/15/2019   Acute pain of right knee 01/11/2019   Trochanteric bursitis of right hip 01/11/2019   Hypertension    Hyperlipidemia    Diabetes mellitus without complication (HCC)    GERD (gastroesophageal reflux disease)    Vitamin D deficiency    Encounter for long-term (current) use of other medications     Past Surgical History:  Procedure Laterality Date   ANKLE SURGERY Right 2009   LUMBAR LAMINECTOMY & DISCECTOMY  08/27/2018   OPEN REDUCTION INTERNAL FIXATION (ORIF) METACARPAL Left 05/25/2014   Procedure: OPEN REDUCTION INTERNAL FIXATION (ORIF) LEFT RING FINGER METACARPAL;  Surgeon: Cindee Salt, MD;  Location: [facility];  Service: Orthopedics;  Laterality: Left;       Home Medications    Prior to Admission medications   Medication Sig Start Date End Date Taking? Authorizing Provider  diclofenac Sodium (VOLTAREN) 1 % GEL Apply 2 g topically 4 (four) times daily. 07/02/23  Yes Crista Nuon, Noberto Retort, PA-C  doxycycline (VIBRA-TABS) 100 MG tablet SMARTSIG:1 Tablet(s) By Mouth Every 12 Hours 06/08/23  Yes [provider]  lisinopril (ZESTRIL) 10 MG tablet Take  1 tablet (10 mg total) by mouth daily. 06/15/23  Yes Moshe Cipro, FNP  metFORMIN (GLUCOPHAGE) 1000 MG tablet Take 1,000 mg by mouth 2 (two) times daily with a meal.   Yes [provider]  Blood Glucose Monitoring Suppl DEVI 1 each by Does not apply route in the morning, at noon, and at bedtime. May substitute to any manufacturer covered by patient's insurance. 06/15/23   Moshe Cipro, FNP  Continuous Glucose Sensor (FREESTYLE LIBRE 3 PLUS SENSOR) MISC Inject 1 each into the skin continuous for 14 days. Change sensor every 15 days. 06/15/23  06/29/23  Moshe Cipro, FNP  glipiZIDE (GLUCOTROL) 5 MG tablet Take 1 tablet (5 mg total) by mouth 2 (two) times daily before a meal. 06/15/23   Moshe Cipro, FNP  Glucose Blood (BLOOD GLUCOSE TEST STRIPS) STRP 1 each by In Vitro route in the morning, at noon, and at bedtime. May substitute to any manufacturer covered by patient's insurance. 06/15/23 07/15/23  Moshe Cipro, FNP  Lancet Device MISC 1 each by Does not apply route in the morning, at noon, and at bedtime. May substitute to any manufacturer covered by patient's insurance. 06/15/23 07/15/23  Moshe Cipro, FNP  Lancets Misc. MISC 1 each by Does not apply route in the morning, at noon, and at bedtime. May substitute to any manufacturer covered by patient's insurance. 06/15/23 07/15/23  Moshe Cipro, FNP  metFORMIN (GLUCOPHAGE-XR) 500 MG 24 hr tablet Take 2 tablets (1,000 mg total) by mouth 2 (two) times daily with a meal. 06/15/23   Moshe Cipro, FNP  silver sulfADIAZINE (SILVADENE) 1 % cream APPLY 1 APPLICATION TOPICALLY DAILY Patient not taking: Reported on 06/15/2023 01/14/23   Standiford, Jenelle Mages, DPM    Family History Family History  Problem Relation Age of Onset   Diabetes Mother    High blood pressure Mother     Social History Social History   Tobacco Use   Smoking status: Never   Smokeless tobacco: Never  Vaping Use   Vaping status: Never Used  Substance Use Topics   Alcohol use: Yes    Alcohol/week: 3.0 - 4.0 standard drinks of alcohol    Types: 3 - 4 Cans of beer per week   Drug use: No     Allergies   Patient has no known allergies.   Review of Systems Review of Systems  Constitutional:  Positive for activity change. Negative for appetite change, fatigue and fever.  Eyes:  Negative for visual disturbance.  Respiratory:  Negative for cough and shortness of breath.   Cardiovascular:  Negative for chest pain, palpitations and leg swelling.  Gastrointestinal:  Negative for  abdominal pain, diarrhea, nausea and vomiting.  Musculoskeletal:  Positive for arthralgias, gait problem and joint swelling. Negative for myalgias.  Skin:  Negative for color change and wound.  Neurological:  Negative for dizziness, light-headedness and headaches.     Physical Exam Triage Vital Signs ED Triage Vitals  Encounter Vitals Group     BP 07/02/23 1912 (!) 189/99     Systolic BP Percentile --      Diastolic BP Percentile --      Pulse Rate 07/02/23 1912 94     Resp 07/02/23 1912 18     Temp 07/02/23 1912 99 F (37.2 C)     Temp Source 07/02/23 1912 Oral     SpO2 07/02/23 1912 98 %     Weight 07/02/23 1910 186 lb 15.2 oz (84.8 kg)     Height --  Head Circumference --      Peak Flow --      Pain Score 07/02/23 1910 7     Pain Loc --      Pain Education --      Exclude from Growth Chart --    No data found.  Updated Vital Signs BP (!) 189/99 (BP Location: Left Arm)   Pulse 94   Temp 99 F (37.2 C) (Oral)   Resp 18   Wt 186 lb 15.2 oz (84.8 kg)   SpO2 98%   BMI 28.43 kg/m   Visual Acuity Right Eye Distance:   Left Eye Distance:   Bilateral Distance:    Right Eye Near:   Left Eye Near:    Bilateral Near:     Physical Exam Vitals reviewed.  Constitutional:      General: He is awake.     Appearance: Normal appearance. He is well-developed. He is not ill-appearing.     Comments: Very pleasant male appears stated age in no acute distress sitting comfortably in exam room  HENT:     Head: Normocephalic and atraumatic.  Cardiovascular:     Rate and Rhythm: Normal rate and regular rhythm.     Pulses:          Posterior tibial pulses are 2+ on the left side.     Heart sounds: Normal heart sounds, S1 normal and S2 normal. No murmur heard. Pulmonary:     Effort: Pulmonary effort is normal.     Breath sounds: Normal breath sounds. No stridor. No wheezing, rhonchi or rales.     Comments: Clear to auscultation bilaterally Musculoskeletal:     Right knee:  No swelling or deformity. Normal range of motion. Tenderness present over the medial joint line. No lateral joint line tenderness. No LCL laxity, MCL laxity, ACL laxity or PCL laxity.     Instability Tests: Anterior drawer test negative. Posterior drawer test negative. Medial McMurray test negative and lateral McMurray test negative.     Comments: Right knee: Tenderness over medial joint line.  No deformity or step-off noted.  No ligamentous laxity noted on exam.  No effusion or significant swelling noted.  Normal passive range of motion.  Neurological:     Mental Status: He is alert.  Psychiatric:        Behavior: Behavior is cooperative.      UC Treatments / Results  Labs (all labs ordered are listed, but only abnormal results are displayed) Labs Reviewed - No data to display  EKG   Radiology No results found.  Procedures Procedures (including critical care time)  Medications Ordered in UC Medications - No data to display  Initial Impression / Assessment and Plan / UC Course  I have reviewed the triage vital signs and the nursing notes.  Pertinent labs & imaging results that were available during my care of the patient were reviewed by me and considered in my medical decision making (see chart for details).     Patient is well-appearing, afebrile, nontoxic, nontachycardic.  No ligamentous laxity on exam.  Suspect overuse injury versus patellofemoral syndrome as etiology of symptoms.  X-ray was obtained given his tenderness over inferior joint line that showed no acute osseous abnormality based on my primary read.  At the time of discharge we were waiting for radiologist over read and we will contact him if this differs and changes our treatment plan.  He was encouraged to use RICE protocol and was given a brace for  comfort and support.  He was given topical Voltaren to help manage his symptoms with instruction to take additional NSAIDs but he can take Tylenol as needed.  If his  symptoms are not improving quickly with conservative treatment he is to follow-up with orthopedics and was given the contact information for local provider.  We discussed that if anything worsens or changes he needs to be seen immediately.  Strict return precautions given.  Excuse note provided.  His blood pressure is very elevated but he denies any signs/symptoms of endorgan damage.  He reports compliance with his blood pressure medicine.  He was encouraged to avoid NSAIDs, decongestants, caffeine, sodium.  Recommended that he monitor it at home and if this persistently elevated follow-up with his primary care soon as possible.  We discussed that if anything worsens and he has chest pain, shortness of breath, headache, vision change, dizziness in setting of high blood pressure he needs to go to the ER to which he expressed understanding and agreement.  Final Clinical Impressions(s) / UC Diagnoses   Final diagnoses:  Right medial knee pain  Sprain of right knee, unspecified ligament, initial encounter  Elevated blood pressure reading with diagnosis of hypertension     Discharge Instructions      I will contact you if the radiologist sees anything on your x-ray that changes our treatment plan.  Use the brace for comfort and support.  Keep your knee elevated and use ice for 15 minutes at a time 3-4 times per day.  Avoid strenuous activity including lots of walking.  Apply diclofenac up to 4 times a day.  Do not take NSAIDs (aspirin, ibuprofen/Advil, naproxen/Aleve).  You can use Tylenol as needed.  If your symptoms are not improving please follow-up with orthopedics; call to schedule an appointment.  If anything worsens please return for reevaluation.  Your blood pressure is very elevated.  Please monitor this at home.  Follow-up with your primary care.  Continue blood pressure medication.  If you develop any chest pain, shortness of breath, headache, vision change, dizziness in the setting of high  blood pressure you need to go to the ER.     ED Prescriptions     Medication Sig Dispense Auth. Provider   diclofenac Sodium (VOLTAREN) 1 % GEL Apply 2 g topically 4 (four) times daily. 100 g Jaslyne Beeck K, PA-C      PDMP not reviewed this encounter.   Jeani Hawking, PA-C 07/02/23 2143

## 2023-07-07 ENCOUNTER — Encounter: Payer: Self-pay | Admitting: Family Medicine

## 2023-07-07 LAB — HM DIABETES EYE EXAM

## 2023-07-13 NOTE — Progress Notes (Unsigned)
   Established Patient Office Visit  Subjective   Patient ID: William Moran, male    DOB: 1970-09-09  Age: 53 y.o. MRN: [id number]  No chief complaint on file.   HPI Patient presents today for medication management. Reports compliance with medication regimen. Denies the need for refills. Not fasting today. Denies other concerns. Medical history as outlined below.  ROS Per HPI    Objective:     There were no vitals taken for this visit.  Physical Exam Vitals and nursing note reviewed.  Constitutional:      Appearance: Normal appearance.  HENT:     Head: Normocephalic and atraumatic.  Eyes:     Extraocular Movements: Extraocular movements intact.  Cardiovascular:     Rate and Rhythm: Normal rate and regular rhythm.     Pulses: Normal pulses.     Heart sounds: Normal heart sounds.  Pulmonary:     Effort: Pulmonary effort is normal.     Breath sounds: Normal breath sounds.  Musculoskeletal:        General: Normal range of motion.     Cervical back: Normal range of motion.  Skin:    General: Skin is warm and dry.  Neurological:     General: No focal deficit present.     Mental Status: He is alert and oriented to person, place, and time.  Psychiatric:        Mood and Affect: Mood normal.        Behavior: Behavior normal.    No results found for any visits on 07/14/23.   The ASCVD Risk score (Arnett DK, et al., 2019) failed to calculate for the following reasons:   Cannot find a previous HDL lab   Cannot find a previous total cholesterol lab    Assessment & Plan:   There are no diagnoses linked to this encounter.   No follow-ups on file.    Moshe Cipro, FNP

## 2023-07-14 ENCOUNTER — Telehealth: Payer: Self-pay

## 2023-07-14 ENCOUNTER — Encounter: Payer: Self-pay | Admitting: Family Medicine

## 2023-07-14 ENCOUNTER — Other Ambulatory Visit (HOSPITAL_COMMUNITY): Payer: Self-pay

## 2023-07-14 ENCOUNTER — Ambulatory Visit: Payer: BC Managed Care – PPO | Admitting: Family Medicine

## 2023-07-14 VITALS — BP 154/92 | HR 90 | Temp 98.0°F | Ht 68.0 in | Wt 192.4 lb

## 2023-07-14 DIAGNOSIS — M25561 Pain in right knee: Secondary | ICD-10-CM

## 2023-07-14 DIAGNOSIS — E1165 Type 2 diabetes mellitus with hyperglycemia: Secondary | ICD-10-CM | POA: Diagnosis not present

## 2023-07-14 DIAGNOSIS — I10 Essential (primary) hypertension: Secondary | ICD-10-CM

## 2023-07-14 DIAGNOSIS — E7849 Other hyperlipidemia: Secondary | ICD-10-CM | POA: Diagnosis not present

## 2023-07-14 DIAGNOSIS — Z79899 Other long term (current) drug therapy: Secondary | ICD-10-CM

## 2023-07-14 DIAGNOSIS — Z7984 Long term (current) use of oral hypoglycemic drugs: Secondary | ICD-10-CM

## 2023-07-14 MED ORDER — LISINOPRIL 20 MG PO TABS: 20.0000 mg | ORAL_TABLET | Freq: Every day | ORAL | 1 refills | Status: DC

## 2023-07-14 NOTE — Assessment & Plan Note (Signed)
 Increase to lisinopril to 20 mg once daily, continue to check blood pressures  Follow-up with me in 2 months

## 2023-07-14 NOTE — Telephone Encounter (Signed)
 Pharmacy Patient Advocate Encounter   Received notification from Pt Calls Messages that prior authorization for Freestyle Libre 3Plus sensors is required/requested.   Insurance verification completed.   The patient is insured through [employer] .   Per test claim: PA required; PA submitted to above mentioned insurance via Phone Key/confirmation #/EOC [id number] Status is pending   Phone# [phone]

## 2023-07-14 NOTE — Telephone Encounter (Signed)
 Please initiate a PA for patients Canones sensors

## 2023-07-14 NOTE — Assessment & Plan Note (Signed)
 PA started for freestyle libre sensors Continue metformin and glipizide Follow-up in 2 months for meds and labs

## 2023-07-14 NOTE — Assessment & Plan Note (Signed)
 Continue current medication regimen, increase lisinopril today

## 2023-07-14 NOTE — Patient Instructions (Addendum)
 Check BP at home once daily in the mornings for the next month. Follow up with me in a month to recheck blood pressures.  INCREASE lisinopril to 20mg  once daily  Continue metformin and glipizide  Referral to orthopedics today. Someone will be calling to get you scheduled.   Follow up with me in 2 mos for med check, labs

## 2023-07-14 NOTE — Assessment & Plan Note (Signed)
 Continue efforts and low-fat diet and healthy activity level

## 2023-07-14 NOTE — Assessment & Plan Note (Signed)
 Referral to Ortho today

## 2023-07-16 ENCOUNTER — Ambulatory Visit: Admitting: Orthopaedic Surgery

## 2023-07-16 DIAGNOSIS — M1711 Unilateral primary osteoarthritis, right knee: Secondary | ICD-10-CM

## 2023-07-16 MED ORDER — MELOXICAM 7.5 MG PO TABS: 7.5000 mg | ORAL_TABLET | Freq: Two times a day (BID) | ORAL | 2 refills | Status: AC | PRN

## 2023-07-16 NOTE — Progress Notes (Addendum)
 Office Visit Note   Patient: William Moran           Date of Birth: [birth year]           MRN: [id number] Visit Date: 07/16/2023              Requested by: Moshe Cipro, FNP [address] [place],  [place] [address] PCP: Moshe Cipro, FNP   Assessment & Plan: Visit Diagnoses:  1. Primary osteoarthritis of right knee     Plan: William Moran is a very pleasant 53 year old gentleman with right knee pain probable osteoarthritis flare.  Treatment options were discussed and he would like to try a course of meloxicam.  I have written him out of work for the rest of the week.  Will see him back as needed.  Follow-Up Instructions: No follow-ups on file.   Orders:  No orders of the defined types were placed in this encounter.  Meds ordered this encounter  Medications   meloxicam (MOBIC) 7.5 MG tablet    Sig: Take 1 tablet (7.5 mg total) by mouth 2 (two) times daily as needed for pain.    Dispense:  30 tablet    Refill:  2      Procedures: No procedures performed   Clinical Data: No additional findings.   Subjective: Chief Complaint  Patient presents with   Right Knee - Pain    HPI William Moran is a very pleasant 53 year old gentleman here for right knee pain for about 3 weeks.  Denies any injuries or changes in activities.  Does walk a lot for work.  Feels like the leg wants to give out. Review of Systems  Constitutional: Negative.   HENT: Negative.    Eyes: Negative.   Respiratory: Negative.    Cardiovascular: Negative.   Gastrointestinal: Negative.   Endocrine: Negative.   Genitourinary: Negative.   Skin: Negative.   Allergic/Immunologic: Negative.   Neurological: Negative.   Hematological: Negative.   Psychiatric/Behavioral: Negative.    All other systems reviewed and are negative.    Objective: Vital Signs: There were no vitals taken for this visit.  Physical Exam Vitals and nursing note reviewed.  Constitutional:       Appearance: He is well-developed.  HENT:     Head: Normocephalic and atraumatic.  Eyes:     Pupils: Pupils are equal, round, and reactive to light.  Pulmonary:     Effort: Pulmonary effort is normal.  Abdominal:     Palpations: Abdomen is soft.  Musculoskeletal:        General: Normal range of motion.     Cervical back: Neck supple.  Skin:    General: Skin is warm.  Neurological:     Mental Status: He is alert and oriented to person, place, and time.  Psychiatric:        Behavior: Behavior normal.        Thought Content: Thought content normal.        Judgment: Judgment normal.     Ortho Exam Exam of the right knee shows no joint effusion.  Baseline range of motion.  Collaterals and cruciates are stable. Specialty Comments:  No specialty comments available.  Imaging: No results found.   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/15/2019   Diabetic neuropathy (HCC) 01/15/2019  Acute pain of right knee 01/11/2019   Trochanteric bursitis of right hip 01/11/2019   Hypertension    Hyperlipidemia    Diabetes mellitus without complication (HCC)    GERD (gastroesophageal reflux disease)    Vitamin D deficiency    Medication management    Past Medical History:  Diagnosis Date   Diabetes mellitus without complication (HCC)    on Metformin and insulin   Encounter for long-term (current) use of other medications    GERD (gastroesophageal reflux disease)    pt states this is monitored and no medication has been prescribed    Hyperlipidemia    pt states this is monitored and no medication has been prescribed    Hypertension    pt states this is monitored and no medication has been prescribed    Vitamin D deficiency     Family History  Problem Relation  Age of Onset   Diabetes Mother    High blood pressure Mother     Past Surgical History:  Procedure Laterality Date   ANKLE SURGERY Right 2009   FRACTURE SURGERY     LUMBAR LAMINECTOMY & DISCECTOMY  08/27/2018   OPEN REDUCTION INTERNAL FIXATION (ORIF) METACARPAL Left 05/25/2014   Procedure: OPEN REDUCTION INTERNAL FIXATION (ORIF) LEFT RING FINGER METACARPAL;  Surgeon: Cindee Salt, MD;  Location: [facility];  Service: Orthopedics;  Laterality: Left;   SPINE SURGERY     Social History   Occupational History   Not on file  Tobacco Use   Smoking status: Never   Smokeless tobacco: Never  Vaping Use   Vaping status: Never Used  Substance and Sexual Activity   Alcohol use: Yes    Alcohol/week: 3.0 - 4.0 standard drinks of alcohol    Types: 3 - 4 Cans of beer per week   Drug use: No   Sexual activity: Not on file

## 2023-07-22 ENCOUNTER — Other Ambulatory Visit (HOSPITAL_COMMUNITY): Payer: Self-pay

## 2023-07-23 NOTE — Telephone Encounter (Signed)
 Spoke with patient, gave a verbal understanding. Has been checking blood sugars every other day before a meal. Blood sugars fasting have been running between 180-210.

## 2023-07-23 NOTE — Telephone Encounter (Signed)
 Pharmacy Patient Advocate Encounter  Received notification from [facility] FEP  that Prior Authorization for [facility] has been DENIED.  Full denial letter will be uploaded to the media tab. See denial reason below.

## 2023-07-30 ENCOUNTER — Other Ambulatory Visit (INDEPENDENT_AMBULATORY_CARE_PROVIDER_SITE_OTHER): Payer: Self-pay

## 2023-07-30 ENCOUNTER — Ambulatory Visit: Admitting: Orthopaedic Surgery

## 2023-07-30 ENCOUNTER — Encounter: Payer: Self-pay | Admitting: Orthopaedic Surgery

## 2023-07-30 DIAGNOSIS — M25551 Pain in right hip: Secondary | ICD-10-CM

## 2023-07-30 DIAGNOSIS — M1711 Unilateral primary osteoarthritis, right knee: Secondary | ICD-10-CM | POA: Diagnosis not present

## 2023-07-30 MED ORDER — BUPIVACAINE HCL 0.5 % IJ SOLN
2.0000 mL | INTRAMUSCULAR | Status: AC | PRN
Start: 2023-07-30 — End: 2023-07-30
  Administered 2023-07-30: 2 mL via INTRA_ARTICULAR

## 2023-07-30 MED ORDER — LIDOCAINE HCL 1 % IJ SOLN
2.0000 mL | INTRAMUSCULAR | Status: AC | PRN
  Administered 2023-07-30: 2 mL

## 2023-07-30 MED ORDER — METHYLPREDNISOLONE ACETATE 40 MG/ML IJ SUSP
40.0000 mg | INTRAMUSCULAR | Status: AC | PRN
  Administered 2023-07-30: 40 mg via INTRA_ARTICULAR

## 2023-07-30 NOTE — Progress Notes (Signed)
 Office Visit Note   Patient: William Moran           Date of Birth: 20-Feb-1971           MRN: [id number] Visit Date: 07/30/2023              Requested by: Sherald Barge, FNP [address] [place],  [place] [address] PCP: Sherald Barge, FNP   Assessment & Plan: Visit Diagnoses:  1. Primary osteoarthritis of right knee   2. Pain in right hip     Plan: William Moran is a 53 year old gentleman with right knee and right hip osteoarthritis.  Both are fairly mild.  Treatment options explained and he opted for a right knee cortisone injection today which he tolerated well.  He will wait a couple weeks and follow-up with Dr. Shon Baton for hip injection if he is still interested.  Follow-Up Instructions: No follow-ups on file.   Orders:  Orders Placed This Encounter  Procedures   XR HIP UNILAT W OR W/O PELVIS 2-3 VIEWS RIGHT   No orders of the defined types were placed in this encounter.     Procedures: Large Joint Inj: R knee on 07/30/2023 10:16 AM Indications: pain Details: 22 G needle  Arthrogram: No  Medications: 40 mg methylPREDNISolone acetate 40 MG/ML; 2 mL lidocaine 1 %; 2 mL bupivacaine 0.5 % Consent was given by the patient. Patient was prepped and draped in the usual sterile fashion.       Clinical Data: No additional findings.   Subjective: Chief Complaint  Patient presents with   Right Hip - Pain   Right Knee - Pain    HPI William Moran is a 53 year old gentleman here for evaluation of right knee and right hip pain.  I saw him for right knee pain and prescribed meloxicam.  Symptoms have not improved.  He is also reporting new problem of right hip and groin pain for 3 days.  He is on his feet a lot.  Works at the [employer].  Meloxicam does not help.  Denies any radicular symptoms. Review of Systems  Constitutional: Negative.   HENT: Negative.    Eyes: Negative.   Respiratory: Negative.    Cardiovascular: Negative.    Gastrointestinal: Negative.   Endocrine: Negative.   Genitourinary: Negative.   Skin: Negative.   Allergic/Immunologic: Negative.   Neurological: Negative.   Hematological: Negative.   Psychiatric/Behavioral: Negative.    All other systems reviewed and are negative.    Objective: Vital Signs: There were no vitals taken for this visit.  Physical Exam Vitals and nursing note reviewed.  Constitutional:      Appearance: He is well-developed.  HENT:     Head: Normocephalic and atraumatic.  Eyes:     Pupils: Pupils are equal, round, and reactive to light.  Pulmonary:     Effort: Pulmonary effort is normal.  Abdominal:     Palpations: Abdomen is soft.  Musculoskeletal:        General: Normal range of motion.     Cervical back: Neck supple.  Skin:    General: Skin is warm.  Neurological:     Mental Status: He is alert and oriented to person, place, and time.  Psychiatric:        Behavior: Behavior normal.        Thought Content: Thought content normal.        Judgment: Judgment normal.     Ortho Exam Exam of the right knee is  unchanged from prior visit.  No joint effusion. Exam of the right hip shows slight groin pain with FADIR and Stinchfield.  No trochanteric tenderness. Specialty Comments:  No specialty comments available.  Imaging: XR HIP UNILAT W OR W/O PELVIS 2-3 VIEWS RIGHT Result Date: 07/30/2023 X-rays of the right hip show mild osteoarthritis with slight periarticular spurring of the acetabulum.    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/15/2019   Diabetic neuropathy (HCC) 01/15/2019   Acute pain of right knee 01/11/2019   Trochanteric bursitis of right hip 01/11/2019   Hypertension    Hyperlipidemia    Diabetes mellitus without  complication (HCC)    GERD (gastroesophageal reflux disease)    Vitamin D deficiency    Medication management    Past Medical History:  Diagnosis Date   Diabetes mellitus without complication (HCC)    on Metformin and insulin   Encounter for long-term (current) use of other medications    GERD (gastroesophageal reflux disease)    pt states this is monitored and no medication has been prescribed    Hyperlipidemia    pt states this is monitored and no medication has been prescribed    Hypertension    pt states this is monitored and no medication has been prescribed    Vitamin D deficiency     Family History  Problem Relation Age of Onset   Diabetes Mother    High blood pressure Mother     Past Surgical History:  Procedure Laterality Date   ANKLE SURGERY Right 2009   FRACTURE SURGERY     LUMBAR LAMINECTOMY & DISCECTOMY  08/27/2018   OPEN REDUCTION INTERNAL FIXATION (ORIF) METACARPAL Left 05/25/2014   Procedure: OPEN REDUCTION INTERNAL FIXATION (ORIF) LEFT RING FINGER METACARPAL;  Surgeon: Cindee Salt, MD;  Location: [facility];  Service: Orthopedics;  Laterality: Left;   SPINE SURGERY     Social History   Occupational History   Not on file  Tobacco Use   Smoking status: Never   Smokeless tobacco: Never  Vaping Use   Vaping status: Never Used  Substance and Sexual Activity   Alcohol use: Yes    Alcohol/week: 3.0 - 4.0 standard drinks of alcohol    Types: 3 - 4 Cans of beer per week   Drug use: No   Sexual activity: Not on file

## 2023-08-06 ENCOUNTER — Other Ambulatory Visit: Payer: Self-pay | Admitting: Family Medicine

## 2023-08-06 DIAGNOSIS — I10 Essential (primary) hypertension: Secondary | ICD-10-CM

## 2023-09-16 ENCOUNTER — Ambulatory Visit: Admitting: Family Medicine

## 2023-09-16 NOTE — Progress Notes (Deleted)
   Established Patient Office Visit  Subjective   Patient ID: William Moran, male    DOB: December 26, 1970  Age: 53 y.o. MRN: [id number]  No chief complaint on file.   HPI Patient presents today for medication management and f/u diabetes. Reports compliance with medication regimen. Denies the need for refills. Not fasting today. Denies other concerns. Medical history as outlined below.  ROS Per HPI    Objective:     There were no vitals taken for this visit.  Physical Exam Vitals and nursing note reviewed.  Constitutional:      General: He is not in acute distress.    Appearance: Normal appearance.  HENT:     Head: Normocephalic and atraumatic.  Eyes:     Extraocular Movements: Extraocular movements intact.  Cardiovascular:     Rate and Rhythm: Normal rate and regular rhythm.     Pulses: Normal pulses.     Heart sounds: Normal heart sounds.  Pulmonary:     Effort: Pulmonary effort is normal. No respiratory distress.     Breath sounds: Normal breath sounds. No wheezing, rhonchi or rales.  Musculoskeletal:     Cervical back: Normal range of motion.     Comments: Brace to R knee, brace to L foot  Lymphadenopathy:     Cervical: No cervical adenopathy.  Skin:    General: Skin is warm and dry.  Neurological:     General: No focal deficit present.     Mental Status: He is alert and oriented to person, place, and time.  Psychiatric:        Mood and Affect: Mood normal.        Behavior: Behavior normal.    No results found for any visits on 09/16/23.   The ASCVD Risk score (Arnett DK, et al., 2019) failed to calculate for the following reasons:   Cannot find a previous HDL lab   Cannot find a previous total cholesterol lab    Assessment & Plan:   Primary hypertension  Type 2 diabetes mellitus with hyperglycemia, without long-term current use of insulin  (HCC)  Other hyperlipidemia  Medication management     No follow-ups on file.    Wellington Half, FNP

## 2023-09-16 NOTE — Patient Instructions (Incomplete)
 We are checking labs today, will be in contact with any results that require further attention

## 2023-09-24 NOTE — Patient Instructions (Incomplete)
 We are checking labs today, will be in contact with any results that require further attention  Check BP at home once daily in the mornings for the next month.   Follow up with me in a month to recheck blood pressures.  Follow up with me in about 3 months for labs and medication management, sooner if needed.  Please go to [address] to the lab

## 2023-09-24 NOTE — Progress Notes (Unsigned)
   Established Patient Office Visit  Subjective   Patient ID: William Moran, male    DOB: [birth year]  Age: 53 y.o. MRN: [id number]  No chief complaint on file.   HPI Patient presents today for medication management and f/u diabetes. Reports compliance with medication regimen. Denies the need for refills. Not fasting today. Denies other concerns. Medical history as outlined below.  ROS Per HPI    Objective:     There were no vitals taken for this visit.  Physical Exam Vitals and nursing note reviewed.  Constitutional:      General: He is not in acute distress.    Appearance: Normal appearance.  HENT:     Head: Normocephalic and atraumatic.  Eyes:     Extraocular Movements: Extraocular movements intact.  Cardiovascular:     Rate and Rhythm: Normal rate and regular rhythm.     Pulses: Normal pulses.     Heart sounds: Normal heart sounds.  Pulmonary:     Effort: Pulmonary effort is normal. No respiratory distress.     Breath sounds: Normal breath sounds. No wheezing, rhonchi or rales.  Musculoskeletal:     Cervical back: Normal range of motion.     Comments: Brace to R knee, brace to L foot  Lymphadenopathy:     Cervical: No cervical adenopathy.  Skin:    General: Skin is warm and dry.  Neurological:     General: No focal deficit present.     Mental Status: He is alert and oriented to person, place, and time.  Psychiatric:        Mood and Affect: Mood normal.        Behavior: Behavior normal.     No results found for any visits on 09/25/23.   The ASCVD Risk score (Arnett DK, et al., 2019) failed to calculate for the following reasons:   Cannot find a previous HDL lab   Cannot find a previous total cholesterol lab    Assessment & Plan:   There are no diagnoses linked to this encounter.    No follow-ups on file.    Wellington Half, FNP

## 2023-09-25 ENCOUNTER — Encounter: Payer: Self-pay | Admitting: Family Medicine

## 2023-09-25 ENCOUNTER — Ambulatory Visit: Payer: Self-pay | Admitting: Family Medicine

## 2023-09-25 ENCOUNTER — Ambulatory Visit: Admitting: Family Medicine

## 2023-09-25 ENCOUNTER — Other Ambulatory Visit

## 2023-09-25 VITALS — BP 152/90 | HR 106 | Temp 98.6°F | Ht 68.0 in | Wt 194.6 lb

## 2023-09-25 DIAGNOSIS — I10 Essential (primary) hypertension: Secondary | ICD-10-CM

## 2023-09-25 DIAGNOSIS — E782 Mixed hyperlipidemia: Secondary | ICD-10-CM

## 2023-09-25 DIAGNOSIS — Z79899 Other long term (current) drug therapy: Secondary | ICD-10-CM

## 2023-09-25 DIAGNOSIS — E1344 Other specified diabetes mellitus with diabetic amyotrophy: Secondary | ICD-10-CM

## 2023-09-25 DIAGNOSIS — E1165 Type 2 diabetes mellitus with hyperglycemia: Secondary | ICD-10-CM

## 2023-09-25 DIAGNOSIS — E559 Vitamin D deficiency, unspecified: Secondary | ICD-10-CM | POA: Diagnosis not present

## 2023-09-25 DIAGNOSIS — K5900 Constipation, unspecified: Secondary | ICD-10-CM

## 2023-09-25 DIAGNOSIS — Z7984 Long term (current) use of oral hypoglycemic drugs: Secondary | ICD-10-CM

## 2023-09-25 LAB — CBC WITH DIFFERENTIAL/PLATELET
Basophils Absolute: 0.1 10*3/uL (ref 0.0–0.1)
Basophils Relative: 0.7 % (ref 0.0–3.0)
Eosinophils Absolute: 0.1 10*3/uL (ref 0.0–0.7)
Eosinophils Relative: 0.9 % (ref 0.0–5.0)
HCT: 39.7 % (ref 39.0–52.0)
Hemoglobin: 13 g/dL (ref 13.0–17.0)
Lymphocytes Relative: 33.9 % (ref 12.0–46.0)
Lymphs Abs: 2.6 10*3/uL (ref 0.7–4.0)
MCHC: 32.7 g/dL (ref 30.0–36.0)
MCV: 86.4 fl (ref 78.0–100.0)
Monocytes Absolute: 0.6 10*3/uL (ref 0.1–1.0)
Monocytes Relative: 7.6 % (ref 3.0–12.0)
Neutro Abs: 4.3 10*3/uL (ref 1.4–7.7)
Neutrophils Relative %: 56.9 % (ref 43.0–77.0)
Platelets: 251 10*3/uL (ref 150.0–400.0)
RBC: 4.6 Mil/uL (ref 4.22–5.81)
RDW: 14.3 % (ref 11.5–15.5)
WBC: 7.6 10*3/uL (ref 4.0–10.5)

## 2023-09-25 LAB — LIPID PANEL
Cholesterol: 164 mg/dL (ref 0–200)
HDL: 48.8 mg/dL (ref >39.00)
LDL Cholesterol: 104 mg/dL — ABNORMAL HIGH (ref 0–99)
NonHDL: 115.01
Total CHOL/HDL Ratio: 3
Triglycerides: 54 mg/dL (ref 0.0–149.0)
VLDL: 10.8 mg/dL (ref 0.0–40.0)

## 2023-09-25 LAB — COMPREHENSIVE METABOLIC PANEL WITH GFR
ALT: 17 U/L (ref 0–53)
AST: 15 U/L (ref 0–37)
Albumin: 4.2 g/dL (ref 3.5–5.2)
Alkaline Phosphatase: 49 U/L (ref 39–117)
BUN: 17 mg/dL (ref 6–23)
CO2: 32 meq/L (ref 19–32)
Calcium: 9.2 mg/dL (ref 8.4–10.5)
Chloride: 97 meq/L (ref 96–112)
Creatinine, Ser: 1.01 mg/dL (ref 0.40–1.50)
GFR: 83.86 mL/min (ref >60.00)
Glucose, Bld: 220 mg/dL — ABNORMAL HIGH (ref 70–99)
Potassium: 4.4 meq/L (ref 3.5–5.1)
Sodium: 135 meq/L (ref 135–145)
Total Bilirubin: 0.3 mg/dL (ref 0.2–1.2)
Total Protein: 7.3 g/dL (ref 6.0–8.3)

## 2023-09-25 LAB — HEMOGLOBIN A1C: Hgb A1c MFr Bld: 9.3 % — ABNORMAL HIGH (ref 4.6–6.5)

## 2023-09-25 LAB — MICROALBUMIN / CREATININE URINE RATIO
Creatinine,U: 212 mg/dL
Microalb Creat Ratio: 81.7 mg/g — ABNORMAL HIGH (ref 0.0–30.0)
Microalb, Ur: 17.3 mg/dL — ABNORMAL HIGH (ref 0.0–1.9)

## 2023-09-25 MED ORDER — GLIPIZIDE 5 MG PO TABS: 5.0000 mg | ORAL_TABLET | Freq: Two times a day (BID) | ORAL | 1 refills | Status: AC

## 2023-09-25 MED ORDER — VALSARTAN-HYDROCHLOROTHIAZIDE 320-25 MG PO TABS: 1.0000 | ORAL_TABLET | Freq: Every day | ORAL | 1 refills | Status: DC

## 2023-09-25 MED ORDER — ROSUVASTATIN CALCIUM 10 MG PO TABS: 10.0000 mg | ORAL_TABLET | Freq: Every day | ORAL | 1 refills | Status: DC

## 2023-09-25 MED ORDER — METFORMIN HCL ER 500 MG PO TB24: 1000.0000 mg | ORAL_TABLET | Freq: Two times a day (BID) | ORAL | 1 refills | Status: AC

## 2023-09-25 NOTE — Progress Notes (Signed)
 Established Patient Office Visit  Subjective   Patient ID: William Moran, male    DOB: [birth year]  Age: 53 y.o. MRN: [id number]  No chief complaint on file.   HPI  Patient presents for follow-up of hypertension and diabetes. He reports that he continues to have elevated blood pressures. He is checking his blood pressure at the pharmacy every 2-3 days and has recorded those readings. He did not bring that journal with him to the office today. Patient reports that he has not been checking his blood sugars at home. States he is compliant with his medications and has not experienced any side effects. He reports that he has not been eating a healthy diet and has not been physically active. 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 He also states that his bilateral foot drop has been causing him pain which he feels is also contributing to his high blood pressure. He has been recently fitted for a new brace for his right foot/right leg, but he has not received it yet. He feels the brace will help his pain and blood pressure. Patient reports that he is now on disability.   Patient also complains of long history of constipation. He normally has a bowel movement every 2-3 days. Last week, he had to take Dulcolax to produce a bowel movement.    ROS  See HPI   Objective:     BP (!) 152/90 (BP Location: Right Arm, Patient Position: Sitting, Cuff Size: Normal)   Pulse (!) 106   Temp 98.6 F (37 C) (Temporal)   Ht 5\' 8"  (1.727 m)   Wt 194 lb 9.6 oz (88.3 kg)   SpO2 97%   BMI 29.59 kg/m    Physical Exam Vitals and nursing note reviewed.  Constitutional:      General: He is not in acute distress.    Appearance: Normal appearance.  HENT:     Head: Normocephalic and atraumatic.     Right Ear: Tympanic membrane normal.     Left Ear: Tympanic membrane  normal.     Nose: Nose normal.     Mouth/Throat:     Mouth: Mucous membranes are moist.     Pharynx: Oropharynx is clear.  Eyes:     Conjunctiva/sclera: Conjunctivae normal.     Pupils: Pupils are equal, round, and reactive to light.  Cardiovascular:     Rate and Rhythm: Normal rate and regular rhythm.     Pulses: Normal pulses.     Heart sounds: Normal heart sounds.  Pulmonary:     Effort: Pulmonary effort is normal.     Breath sounds: Normal breath sounds.  Abdominal:     General: Bowel sounds are normal. There is no distension.     Palpations: Abdomen is soft.     Tenderness: There is no abdominal tenderness.  Musculoskeletal:     Cervical back: Normal range of motion.     Comments: Brace noted to left lower leg  Skin:    General: Skin is warm and dry.     Capillary Refill: Capillary refill takes less than 2 seconds.  Neurological:     Mental Status: He is alert and oriented to person, place, and time.  Psychiatric:        Mood and Affect: Mood normal.        Behavior: Behavior normal.  Results for orders placed or performed in visit on 09/25/23  Microalbumin / creatinine urine ratio  Result Value Ref Range   Microalb, Ur 17.3 (H) 0.0 - 1.9 mg/dL   Creatinine,U 409.8 mg/dL   Microalb Creat Ratio 81.7 (H) 0.0 - 30.0 mg/g  Lipid panel  Result Value Ref Range   Cholesterol 164 0 - 200 mg/dL   Triglycerides 11.9 0.0 - 149.0 mg/dL   HDL 14.78 >29.56 mg/dL   VLDL 21.3 0.0 - 08.6 mg/dL   LDL Cholesterol 578 (H) 0 - 99 mg/dL   Total CHOL/HDL Ratio 3    NonHDL 115.01   Hemoglobin A1c  Result Value Ref Range   Hgb A1c MFr Bld 9.3 (H) 4.6 - 6.5 %  Comprehensive metabolic panel with GFR  Result Value Ref Range   Sodium 135 135 - 145 mEq/L   Potassium 4.4 3.5 - 5.1 mEq/L   Chloride 97 96 - 112 mEq/L   CO2 32 19 - 32 mEq/L   Glucose, Bld 220 (H) 70 - 99 mg/dL   BUN 17 6 - 23 mg/dL   Creatinine, Ser 4.69 0.40 - 1.50 mg/dL   Total Bilirubin 0.3 0.2 - 1.2 mg/dL    Alkaline Phosphatase 49 39 - 117 U/L   AST 15 0 - 37 U/L   ALT 17 0 - 53 U/L   Total Protein 7.3 6.0 - 8.3 g/dL   Albumin 4.2 3.5 - 5.2 g/dL   GFR 62.95 >28.41 mL/min   Calcium 9.2 8.4 - 10.5 mg/dL  CBC with Differential/Platelet  Result Value Ref Range   WBC 7.6 4.0 - 10.5 K/uL   RBC 4.60 4.22 - 5.81 Mil/uL   Hemoglobin 13.0 13.0 - 17.0 g/dL   HCT 32.4 40.1 - 02.7 %   MCV 86.4 78.0 - 100.0 fl   MCHC 32.7 30.0 - 36.0 g/dL   RDW 25.3 66.4 - 40.3 %   Platelets 251.0 150.0 - 400.0 K/uL   Neutrophils Relative % 56.9 43.0 - 77.0 %   Lymphocytes Relative 33.9 12.0 - 46.0 %   Monocytes Relative 7.6 3.0 - 12.0 %   Eosinophils Relative 0.9 0.0 - 5.0 %   Basophils Relative 0.7 0.0 - 3.0 %   Neutro Abs 4.3 1.4 - 7.7 K/uL   Lymphs Abs 2.6 0.7 - 4.0 K/uL   Monocytes Absolute 0.6 0.1 - 1.0 K/uL   Eosinophils Absolute 0.1 0.0 - 0.7 K/uL   Basophils Absolute 0.1 0.0 - 0.1 K/uL          Assessment & Plan:   Problem List Items Addressed This Visit     Hypertension - Primary   Relevant Medications   valsartan-hydrochlorothiazide (DIOVAN-HCT) 320-25 MG tablet   Other Relevant Orders   CBC with Differential/Platelet (Completed)   Comprehensive metabolic panel with GFR (Completed)   Hyperlipidemia   Relevant Medications   valsartan-hydrochlorothiazide (DIOVAN-HCT) 320-25 MG tablet   Other Relevant Orders   Lipid panel (Completed)   Vitamin D  deficiency   Medication management   Relevant Orders   CBC with Differential/Platelet (Completed)   Comprehensive metabolic panel with GFR (Completed)   Hemoglobin A1c (Completed)   Lipid panel (Completed)   Microalbumin / creatinine urine ratio (Completed)   Diabetic neuropathy (HCC)   Relevant Medications   glipiZIDE  (GLUCOTROL ) 5 MG tablet   metFORMIN  (GLUCOPHAGE -XR) 500 MG 24 hr tablet   valsartan-hydrochlorothiazide (DIOVAN-HCT) 320-25 MG tablet   Type 2 diabetes mellitus with hyperglycemia, without long-term current use of insulin   (  HCC)   Relevant Medications   glipiZIDE  (GLUCOTROL ) 5 MG tablet   metFORMIN  (GLUCOPHAGE -XR) 500 MG 24 hr tablet   valsartan-hydrochlorothiazide (DIOVAN-HCT) 320-25 MG tablet   Other Relevant Orders   Comprehensive metabolic panel with GFR (Completed)   Hemoglobin A1c (Completed)   Lipid panel (Completed)   Microalbumin / creatinine urine ratio (Completed)   Constipation   Encouraged patient to increase fluid intake and activity.        Return in about 3 months (around 12/26/2023) for Med management and labs.   Hinton Luis, RN Wellington Half, FNP

## 2023-09-25 NOTE — Assessment & Plan Note (Signed)
 Encouraged patient to increase fluid intake and activity.

## 2023-10-29 NOTE — Progress Notes (Deleted)
   Acute Office Visit  Subjective:     Patient ID: William Moran, male    DOB: Jun 13, 1970, 53 y.o.   MRN: [id number]  No chief complaint on file.   HPI  Discussed the use of AI scribe software for clinical note transcription with the patient, who gave verbal consent to proceed.  History of Present Illness      ROS Per HPI      Objective:    There were no vitals taken for this visit.   Physical Exam Vitals and nursing note reviewed.  Constitutional:      General: He is not in acute distress.    Appearance: Normal appearance.  HENT:     Head: Normocephalic and atraumatic.  Eyes:     Extraocular Movements: Extraocular movements intact.  Cardiovascular:     Rate and Rhythm: Normal rate and regular rhythm.     Pulses: Normal pulses.     Heart sounds: Normal heart sounds.  Pulmonary:     Effort: Pulmonary effort is normal. No respiratory distress.     Breath sounds: Normal breath sounds. No wheezing, rhonchi or rales.  Musculoskeletal:     Cervical back: Normal range of motion.     Comments: Brace to R knee, brace to L foot  Lymphadenopathy:     Cervical: No cervical adenopathy.  Skin:    General: Skin is warm and dry.  Neurological:     General: No focal deficit present.     Mental Status: He is alert and oriented to person, place, and time.  Psychiatric:        Mood and Affect: Mood normal.        Behavior: Behavior normal.    No results found for any visits on 10/30/23.      Assessment & Plan:   Assessment and Plan Assessment & Plan       No orders of the defined types were placed in this encounter.   No follow-ups on file.  Corean LITTIE Ku, FNP

## 2023-10-30 ENCOUNTER — Ambulatory Visit: Admitting: Family Medicine

## 2023-10-30 DIAGNOSIS — Z79899 Other long term (current) drug therapy: Secondary | ICD-10-CM

## 2023-10-30 DIAGNOSIS — E1165 Type 2 diabetes mellitus with hyperglycemia: Secondary | ICD-10-CM

## 2023-10-30 DIAGNOSIS — K219 Gastro-esophageal reflux disease without esophagitis: Secondary | ICD-10-CM

## 2023-10-30 DIAGNOSIS — E782 Mixed hyperlipidemia: Secondary | ICD-10-CM

## 2023-10-30 DIAGNOSIS — I10 Essential (primary) hypertension: Secondary | ICD-10-CM

## 2023-12-31 NOTE — Patient Instructions (Incomplete)
 We are checking labs today, will be in contact with any results that require further attention  Continue current medication regimen for now.

## 2023-12-31 NOTE — Progress Notes (Deleted)
   Established Patient Office Visit  Subjective:     Patient ID: William Moran, male    DOB: 1970/12/02, 53 y.o.   MRN: [id number]  No chief complaint on file.   HPI  Discussed the use of AI scribe software for clinical note transcription with the patient, who gave verbal consent to proceed.  History of Present Illness      ROS Per HPI      Objective:    There were no vitals taken for this visit.   Physical Exam Vitals and nursing note reviewed.  Constitutional:      General: He is not in acute distress.    Appearance: Normal appearance.  HENT:     Head: Normocephalic and atraumatic.     Right Ear: External ear normal.     Left Ear: External ear normal.     Nose: Nose normal.     Mouth/Throat:     Mouth: Mucous membranes are moist.     Pharynx: Oropharynx is clear.  Eyes:     Extraocular Movements: Extraocular movements intact.  Cardiovascular:     Rate and Rhythm: Normal rate and regular rhythm.     Pulses: Normal pulses.     Heart sounds: Normal heart sounds.  Pulmonary:     Effort: Pulmonary effort is normal. No respiratory distress.     Breath sounds: Normal breath sounds. No wheezing, rhonchi or rales.  Musculoskeletal:        General: Normal range of motion.     Cervical back: Normal range of motion.     Right lower leg: No edema.     Left lower leg: No edema.  Lymphadenopathy:     Cervical: No cervical adenopathy.  Skin:    General: Skin is warm and dry.  Neurological:     General: No focal deficit present.     Mental Status: He is alert and oriented to person, place, and time.  Psychiatric:        Mood and Affect: Mood normal.        Behavior: Behavior normal.     No results found for any visits on 01/01/24.  The 10-year ASCVD risk score (Arnett DK, et al., 2019) is: 25.3%  {Vitals History (Optional):23777}  {Show previous labs (optional):23779}     Assessment & Plan:   Assessment and Plan Assessment & Plan      No  orders of the defined types were placed in this encounter.    No orders of the defined types were placed in this encounter.   No follow-ups on file.  Corean LITTIE Ku, FNP

## 2024-01-01 ENCOUNTER — Ambulatory Visit: Admitting: Family Medicine

## 2024-01-01 DIAGNOSIS — Z79899 Other long term (current) drug therapy: Secondary | ICD-10-CM

## 2024-01-01 DIAGNOSIS — E559 Vitamin D deficiency, unspecified: Secondary | ICD-10-CM

## 2024-01-01 DIAGNOSIS — E782 Mixed hyperlipidemia: Secondary | ICD-10-CM

## 2024-01-01 DIAGNOSIS — E1165 Type 2 diabetes mellitus with hyperglycemia: Secondary | ICD-10-CM

## 2024-01-01 DIAGNOSIS — I10 Essential (primary) hypertension: Secondary | ICD-10-CM

## 2024-02-02 ENCOUNTER — Ambulatory Visit: Payer: Self-pay

## 2024-02-02 ENCOUNTER — Ambulatory Visit
Admission: RE | Admit: 2024-02-02 | Discharge: 2024-02-02 | Disposition: A | Discharge status: Home / Self Care | Payer: Self-pay | Source: Ambulatory Visit | Attending: Family Medicine | Admitting: Family Medicine

## 2024-02-02 VITALS — BP 177/92 | HR 98 | Temp 98.0°F | Resp 18 | Wt 194.7 lb

## 2024-02-02 DIAGNOSIS — M79642 Pain in left hand: Secondary | ICD-10-CM

## 2024-02-02 NOTE — ED Triage Notes (Signed)
 Pt presents c/o L hand pain x 3 days. Pt reports he fell out of bed on Sunday morning and used his left hand to break the fall. Pt hand is swollen but has no issue with bending his fingers or making a fist.

## 2024-02-02 NOTE — ED Provider Notes (Signed)
 [facility]   [id number] 02/02/24 Arrival Time: 1604  ASSESSMENT & PLAN:  1. Left hand pain    I have personally viewed and independently interpreted the imaging studies ordered this visit. L hand: no acute changes; screws from old 4th metacarpal fixation in place.  Ice/ibuprofen. Orders Placed This Encounter  Procedures   DG Hand Complete Left    Work/school excuse note: not needed. Recommend:  Follow-up Information     Alvia Corean CROME, FNP.   Specialty: Family Medicine Why: As needed. Contact information: [address] [place] [address] [phone]                  Reviewed expectations re: course of current medical issues. Questions answered. Outlined signs and symptoms indicating need for more acute intervention. Patient verbalized understanding. After Visit Summary given.  SUBJECTIVE: History from: patient. William Moran is a 53 y.o. male who reports L hand pain/swelling. Pt presents c/o L hand pain x 3 days. Pt reports he fell out of bed on Sunday morning and used his left hand to break the fall. Pt hand is swollen but has no issue with bending his fingers or making a fist.  Swelling has improved over past 24-48 hours. Denies extremity sensation changes or weakness.  Past Surgical History:  Procedure Laterality Date   ANKLE SURGERY Right 2009   FRACTURE SURGERY     LUMBAR LAMINECTOMY & DISCECTOMY  08/27/2018   OPEN REDUCTION INTERNAL FIXATION (ORIF) METACARPAL Left 05/25/2014   Procedure: OPEN REDUCTION INTERNAL FIXATION (ORIF) LEFT RING FINGER METACARPAL;  Surgeon: Arley Curia, MD;  Location: [facility];  Service: Orthopedics;  Laterality: Left;   SPINE SURGERY        OBJECTIVE:  Vitals:   02/02/24 1638 02/02/24 1639  BP:  (!) 177/92  Pulse:  98  Resp:  18  Temp:  98 F (36.7 C)  TempSrc:  Oral  SpO2:  98%  Weight: 88.3 kg     General appearance: alert; no distress HEENT: [place];  AT Neck: supple with FROM Resp: unlabored respirations Extremities: LUE: warm with well perfused appearance; poorly localized moderate tenderness over left dorsal hand; without gross deformities; swelling: moderate; bruising: none; wrist/fingers ROM: normal CV: brisk extremity capillary refill of LUE; 2+ radial pulse of LUE. Skin: warm and dry; no visible rashes Neurologic: gait normal; normal sensation and strength of LUE Psychological: alert and cooperative; normal mood and affect  Imaging: DG Hand Complete Left Result Date: 02/02/2024 EXAM: 3 OR MORE VIEW(S) XRAY OF THE LEFT HAND 02/02/2024 04:49:47 PM COMPARISON: Comparison 05/20/2014 status post surgical interval fixation of old fourth metacarpal fracture. CLINICAL HISTORY: Fall. Pt presents c/o L hand pain x 3 days. Pt reports he fell out of bed on Sunday morning and used his left hand to break the fall. Pt hand is swollen but has no issue with bending his fingers or making a fist. FINDINGS: BONES AND JOINTS: No acute fracture. No focal osseous lesion. No joint dislocation. Status post surgical interval fixation of old fourth metacarpal fracture from the comparison study of 05/20/2014. SOFT TISSUES: The soft tissues are unremarkable. IMPRESSION: 1. No acute fracture or dislocation detected. Electronically signed by: Lynwood Seip MD 02/02/2024 05:09 PM EDT RP Workstation: [id number]      No Known Allergies  Past Medical History:  Diagnosis Date   Diabetes mellitus without complication (HCC)    on Metformin  and insulin    Encounter for long-term (current) use of other medications  GERD (gastroesophageal reflux disease)    pt states this is monitored and no medication has been prescribed    Hyperlipidemia    pt states this is monitored and no medication has been prescribed    Hypertension    pt states this is monitored and no medication has been prescribed    Vitamin D  deficiency    Social History   Socioeconomic History    Marital status: Single    Spouse name: Not on file   Number of children: 0   Years of education: Not on file   Highest education level: 12th grade  Occupational History   Not on file  Tobacco Use   Smoking status: Never    Passive exposure: Never   Smokeless tobacco: Never  Vaping Use   Vaping status: Never Used  Substance and Sexual Activity   Alcohol use: Yes    Alcohol/week: 3.0 - 4.0 standard drinks of alcohol    Types: 3 - 4 Cans of beer per week   Drug use: No   Sexual activity: Not on file  Other Topics Concern   Not on file  Social History Narrative   His mother lives with him   Left handed   Social Drivers of Health   Financial Resource Strain: Low Risk  (06/14/2023)   Overall Financial Resource Strain (CARDIA)    Difficulty of Paying Living Expenses: Not very hard  Food Insecurity: No Food Insecurity (06/14/2023)   Hunger Vital Sign    Worried About Running Out of Food in the Last Year: Never true    Ran Out of Food in the Last Year: Never true  Transportation Needs: No Transportation Needs (06/14/2023)   PRAPARE - Administrator, Civil Service (Medical): No    Lack of Transportation (Non-Medical): No  Physical Activity: Sufficiently Active (06/14/2023)   Exercise Vital Sign    Days of Exercise per Week: 6 days    Minutes of Exercise per Session: 130 min  Stress: Stress Concern Present (06/14/2023)   [employer] of Occupational Health - Occupational Stress Questionnaire    Feeling of Stress : To some extent  Social Connections: Moderately Isolated (06/14/2023)   Social Connection and Isolation Panel    Frequency of Communication with Friends and Family: Three times a week    Frequency of Social Gatherings with Friends and Family: More than three times a week    Attends Religious Services: More than 4 times per year    Active Member of Clubs or Organizations: No    Attends Engineer, structural: Not on file    Marital Status: Never  married   Family History  Problem Relation Age of Onset   Diabetes Mother    High blood pressure Mother    Past Surgical History:  Procedure Laterality Date   ANKLE SURGERY Right 2009   FRACTURE SURGERY     LUMBAR LAMINECTOMY & DISCECTOMY  08/27/2018   OPEN REDUCTION INTERNAL FIXATION (ORIF) METACARPAL Left 05/25/2014   Procedure: OPEN REDUCTION INTERNAL FIXATION (ORIF) LEFT RING FINGER METACARPAL;  Surgeon: Arley Curia, MD;  Location: [facility];  Service: Orthopedics;  Laterality: Left;   SPINE SURGERY         Rolinda Rogue, MD 02/02/24 [phone]

## 2024-02-22 ENCOUNTER — Encounter: Payer: Self-pay | Admitting: Radiology

## 2024-03-28 ENCOUNTER — Other Ambulatory Visit: Payer: Self-pay | Admitting: Family Medicine

## 2024-03-28 DIAGNOSIS — E782 Mixed hyperlipidemia: Secondary | ICD-10-CM

## 2024-03-28 DIAGNOSIS — I10 Essential (primary) hypertension: Secondary | ICD-10-CM

## 2024-05-14 ENCOUNTER — Encounter (HOSPITAL_COMMUNITY): Payer: Self-pay | Admitting: *Deleted

## 2024-05-14 ENCOUNTER — Encounter: Payer: Self-pay | Admitting: *Deleted

## 2024-05-14 ENCOUNTER — Emergency Department (HOSPITAL_COMMUNITY): Payer: Self-pay

## 2024-05-14 ENCOUNTER — Ambulatory Visit
Admission: EM | Admit: 2024-05-14 | Discharge: 2024-05-14 | Disposition: A | Discharge status: Home / Self Care | Payer: Self-pay

## 2024-05-14 ENCOUNTER — Other Ambulatory Visit: Payer: Self-pay

## 2024-05-14 ENCOUNTER — Emergency Department (HOSPITAL_COMMUNITY)
Admission: EM | Admit: 2024-05-14 | Discharge: 2024-05-14 | Disposition: A | Discharge status: Home / Self Care | Payer: Self-pay | Attending: Emergency Medicine | Admitting: Emergency Medicine

## 2024-05-14 DIAGNOSIS — L97519 Non-pressure chronic ulcer of other part of right foot with unspecified severity: Secondary | ICD-10-CM

## 2024-05-14 DIAGNOSIS — L089 Local infection of the skin and subcutaneous tissue, unspecified: Secondary | ICD-10-CM

## 2024-05-14 DIAGNOSIS — E11621 Type 2 diabetes mellitus with foot ulcer: Secondary | ICD-10-CM

## 2024-05-14 DIAGNOSIS — Z7984 Long term (current) use of oral hypoglycemic drugs: Secondary | ICD-10-CM | POA: Insufficient documentation

## 2024-05-14 DIAGNOSIS — R000 Tachycardia, unspecified: Secondary | ICD-10-CM | POA: Insufficient documentation

## 2024-05-14 DIAGNOSIS — E1165 Type 2 diabetes mellitus with hyperglycemia: Secondary | ICD-10-CM | POA: Insufficient documentation

## 2024-05-14 LAB — CBC WITH DIFFERENTIAL/PLATELET
Abs Immature Granulocytes: 0.03 10*3/uL (ref 0.00–0.07)
Basophils Absolute: 0.1 10*3/uL (ref 0.0–0.1)
Basophils Relative: 1 %
Eosinophils Absolute: 0.1 10*3/uL (ref 0.0–0.5)
Eosinophils Relative: 1 %
HCT: 37.6 % — ABNORMAL LOW (ref 39.0–52.0)
Hemoglobin: 12.5 g/dL — ABNORMAL LOW (ref 13.0–17.0)
Immature Granulocytes: 0 %
Lymphocytes Relative: 22 %
Lymphs Abs: 2.3 10*3/uL (ref 0.7–4.0)
MCH: 29 pg (ref 26.0–34.0)
MCHC: 33.2 g/dL (ref 30.0–36.0)
MCV: 87.2 fL (ref 80.0–100.0)
Monocytes Absolute: 0.7 10*3/uL (ref 0.1–1.0)
Monocytes Relative: 6 %
Neutro Abs: 7.5 10*3/uL (ref 1.7–7.7)
Neutrophils Relative %: 70 %
Platelets: 281 10*3/uL (ref 150–400)
RBC: 4.31 MIL/uL (ref 4.22–5.81)
RDW: 12.4 % (ref 11.5–15.5)
WBC: 10.6 10*3/uL — ABNORMAL HIGH (ref 4.0–10.5)
nRBC: 0 % (ref 0.0–0.2)

## 2024-05-14 LAB — COMPREHENSIVE METABOLIC PANEL WITH GFR
ALT: 32 U/L (ref 0–44)
AST: 34 U/L (ref 15–41)
Albumin: 4.5 g/dL (ref 3.5–5.0)
Alkaline Phosphatase: 73 U/L (ref 38–126)
Anion gap: 15 (ref 5–15)
BUN: 33 mg/dL — ABNORMAL HIGH (ref 6–20)
CO2: 24 mmol/L (ref 22–32)
Calcium: 10 mg/dL (ref 8.9–10.3)
Chloride: 97 mmol/L — ABNORMAL LOW (ref 98–111)
Creatinine, Ser: 1.17 mg/dL (ref 0.61–1.24)
GFR, Estimated: >60 mL/min
Glucose, Bld: 308 mg/dL — ABNORMAL HIGH (ref 70–99)
Potassium: 4.3 mmol/L (ref 3.5–5.1)
Sodium: 136 mmol/L (ref 135–145)
Total Bilirubin: 0.3 mg/dL (ref 0.0–1.2)
Total Protein: 8.4 g/dL — ABNORMAL HIGH (ref 6.5–8.1)

## 2024-05-14 LAB — GLUCOSE, POCT (MANUAL RESULT ENTRY): POC Glucose: 289 mg/dL — AB (ref 70–99)

## 2024-05-14 LAB — I-STAT CG4 LACTIC ACID, ED: Lactic Acid, Venous: 1.4 mmol/L (ref 0.5–1.9)

## 2024-05-14 LAB — CBG MONITORING, ED
Glucose-Capillary: 270 mg/dL — ABNORMAL HIGH (ref 70–99)
Glucose-Capillary: 244 mg/dL — ABNORMAL HIGH (ref 70–99)

## 2024-05-14 MED ORDER — SODIUM CHLORIDE 0.9 % IV SOLN
3.0000 g | Freq: Once | INTRAVENOUS | Status: AC
  Administered 2024-05-14: 3 g via INTRAVENOUS
  Filled 2024-05-14: qty 8

## 2024-05-14 MED ORDER — AMOXICILLIN-POT CLAVULANATE 875-125 MG PO TABS: 1.0000 | ORAL_TABLET | Freq: Two times a day (BID) | ORAL | 0 refills | Status: AC

## 2024-05-14 MED ORDER — INSULIN ASPART 100 UNIT/ML IJ SOLN
6.0000 [IU] | Freq: Once | INTRAMUSCULAR | Status: AC
  Administered 2024-05-14: 6 [IU] via INTRAVENOUS
  Filled 2024-05-14: qty 6

## 2024-05-14 MED ORDER — SODIUM CHLORIDE 0.9 % IV BOLUS
1000.0000 mL | Freq: Once | INTRAVENOUS | Status: AC
  Administered 2024-05-14: 1000 mL via INTRAVENOUS

## 2024-05-14 NOTE — ED Notes (Signed)
 Blister noted to right great toe. Blood noted on dressing when removed. Callous noted to ball of foot below great toe

## 2024-05-14 NOTE — ED Notes (Addendum)
 Patient is being discharged from the Urgent Care and sent to the Emergency Department via POV . Per Corean Endo, PA-C, patient is in need of higher level of care due to Elevated blood sugar and wound infection. Patient is aware and verbalizes understanding of plan of care.  Vitals:   05/14/24 0854  BP: (!) 168/84  Pulse: (!) 108  Resp: 18  Temp: 99.3 F (37.4 C)  SpO2: 95%

## 2024-05-14 NOTE — Discharge Instructions (Addendum)
 Take the antibiotics.  Follow-up with your sugars at home.  Follow-up with podiatry on Thursday as planned.

## 2024-05-14 NOTE — ED Provider Notes (Signed)
 " [place] EMERGENCY DEPARTMENT AT [facility] Provider Note   CSN: [id number] Arrival date & time: 05/14/24  1001     Patient presents with: Wound Check   William Moran is a 54 y.o. male.    Wound Check  Patient presents with right foot pain.  Has had it for around a month now.  Has an appointment to see podiatry in just under a week.  However went to urgent care today because he had drainage of blood into his sock.  Found to have wound on the ball and medial aspect of his foot.  Sent here by urgent care.  He is diabetic his sugars been running high.  Has not been consistent with his treatment of his diabetes.     Prior to Admission medications  Medication Sig Start Date End Date Taking? Authorizing Provider  amoxicillin -clavulanate (AUGMENTIN ) 875-125 MG tablet Take 1 tablet by mouth every 12 (twelve) hours. 05/14/24  Yes Patsey Lot, MD  Blood Glucose Monitoring Suppl DEVI 1 each by Does not apply route in the morning, at noon, and at bedtime. May substitute to any manufacturer covered by patient's insurance. 06/15/23   Alvia Corean CROME, FNP  Continuous Glucose Sensor (FREESTYLE LIBRE 3 PLUS SENSOR) MISC Inject 1 each into the skin continuous for 14 days. Change sensor every 15 days. 06/15/23 06/29/23  Alvia Corean CROME, FNP  diclofenac  Sodium (VOLTAREN ) 1 % GEL Apply 2 g topically 4 (four) times daily. 07/02/23   Raspet, Erin K, PA-C  doxycycline (VIBRA-TABS) 100 MG tablet SMARTSIG:1 Tablet(s) By Mouth Every 12 Hours 06/08/23   [provider]  glipiZIDE  (GLUCOTROL ) 5 MG tablet Take 1 tablet (5 mg total) by mouth 2 (two) times daily before a meal. 09/25/23   Alvia Corean CROME, FNP  ibuprofen (ADVIL) 200 MG tablet Take 200 mg by mouth every 6 (six) hours as needed.    [provider]  meloxicam  (MOBIC ) 7.5 MG tablet Take 1 tablet (7.5 mg total) by mouth 2 (two) times daily as needed for pain. 07/16/23   Jerri Kay HERO, MD  metFORMIN   (GLUCOPHAGE -XR) 500 MG 24 hr tablet Take 2 tablets (1,000 mg total) by mouth 2 (two) times daily with a meal. 09/25/23   Alvia Corean CROME, FNP  rosuvastatin  (CRESTOR ) 10 MG tablet TAKE 1 TABLET BY MOUTH EVERY DAY 03/28/24   Alvia Corean CROME, FNP  silver  sulfADIAZINE  (SILVADENE ) 1 % cream APPLY 1 APPLICATION TOPICALLY DAILY 01/14/23   Standiford, Marsa FALCON, DPM  valsartan -hydrochlorothiazide  (DIOVAN -HCT) 320-25 MG tablet TAKE 1 TABLET BY MOUTH EVERY DAY 03/28/24   Alvia Corean CROME, FNP    Allergies: Patient has no known allergies.    Review of Systems  Updated Vital Signs BP (!) 143/81   Pulse 89   Temp 98 F (36.7 C)   Resp 16   Wt 88 kg   SpO2 100%   BMI 29.50 kg/m   Physical Exam Vitals and nursing note reviewed.  Cardiovascular:     Rate and Rhythm: Tachycardia present.  Musculoskeletal:     Comments: Large blister over medial aspect of ball of right foot.  Some bloody drainage.  Also ulcer on the plantar aspect of the foot.  Some erythema.     (all labs ordered are listed, but only abnormal results are displayed) Labs Reviewed  COMPREHENSIVE METABOLIC PANEL WITH GFR - Abnormal; Notable for the following components:      Result Value   Chloride 97 (*)    Glucose, Bld  308 (*)    BUN 33 (*)    Total Protein 8.4 (*)    All other components within normal limits  CBC WITH DIFFERENTIAL/PLATELET - Abnormal; Notable for the following components:   WBC 10.6 (*)    Hemoglobin 12.5 (*)    HCT 37.6 (*)    All other components within normal limits  CBG MONITORING, ED - Abnormal; Notable for the following components:   Glucose-Capillary 270 (*)    All other components within normal limits  CBG MONITORING, ED - Abnormal; Notable for the following components:   Glucose-Capillary 244 (*)    All other components within normal limits  CULTURE, BLOOD (ROUTINE X 2)  CULTURE, BLOOD (ROUTINE X 2)  I-STAT CG4 LACTIC ACID, ED    EKG: None  Radiology: DG Foot Complete  Right Result Date: 05/14/2024 EXAM: 3 OR MORE VIEW(S) XRAY OF THE FOOT 05/14/2024 11:21:12 AM COMPARISON: None available. CLINICAL HISTORY: Infection. ICD10: [id number] Infection. FINDINGS: BONES AND JOINTS: Distal fibular sideplate present with multiple levels of screws. The long distal tibiofibular screw is fractured. Mild dorsal degenerative spurring along the Lisfranc joint. No bony destructive findings to indicate conventional radiographic evidence of osteomyelitis. No acute fracture. No malalignment. SOFT TISSUES: Dorsal soft tissue swelling of the forefoot. Soft tissue swelling medial to the first metatarsophalangeal joint. IMPRESSION: 1. Dorsal soft tissue swelling of the forefoot and soft tissue swelling medial to the first metatarsophalangeal joint, with no radiographic evidence of osteomyelitis. 2. Distal fibular sideplate and screws with fractured long distal tibiofibular screw. 3. Mild dorsal degenerative spurring at the Lisfranc joint. Electronically signed by: Ryan Salvage MD 05/14/2024 12:09 PM EST RP Workstation: [id number]     Procedures   Medications Ordered in the ED  sodium chloride  0.9 % bolus 1,000 mL (0 mLs Intravenous Stopped 05/14/24 1150)  Ampicillin -Sulbactam (UNASYN ) 3 g in sodium chloride  0.9 % 100 mL IVPB (0 g Intravenous Stopped 05/14/24 1315)  insulin  aspart (novoLOG ) injection 6 Units (6 Units Intravenous Given 05/14/24 1315)                                    Medical Decision Making Amount and/or Complexity of Data Reviewed Labs: ordered. Radiology: ordered.  Risk Prescription drug management.   Patient with wound to right foot.  Also hyperglycemia.  Differential diagnose includes ulcer and infections of various types and severity.  Will get basic blood work culture and x-ray to start with.  White count mildly elevated.  Sugars somewhat elevated but given insulin  has come down.  Has medicines at home he has not been that compliant with.  X-ray does not show  osteomyelitis.  I think patient is reasonable for discharge home.  Given IV Unasyn  here but can take oral Augmentin  at home.  Will discharge and will follow-up with PCP     Final diagnoses:  Diabetic foot infection [facility])    ED Discharge Orders          Ordered    amoxicillin -clavulanate (AUGMENTIN ) 875-125 MG tablet  Every 12 hours        05/14/24 1339               Patsey Lot, MD 05/14/24 1451  "

## 2024-05-14 NOTE — ED Triage Notes (Signed)
 Here by POV from home for R MTP/ great toe blister, wound, bleeding. Onset last night when blister burst. Denies other sx. Denies pain. H/o DM. Sen at [facility] and instructed to come to ED. Appt with podiatry scheduled this coming Thursday. Denies recent ABT. Alert, NAD, calm, interactive, steady gait.

## 2024-05-14 NOTE — ED Provider Notes (Signed)
 " [name]    CSN: [id number] Arrival date & time: 05/14/24  0808      History   Chief Complaint Chief Complaint  Patient presents with   Foot Pain   Wound Check    HPI William Moran is a 54 y.o. male.   Pt, with a hx of DM2 with neuropathy, presents today due to sensation of swelling in the ball of right toe for the past month. Pt states that he has an appt with podiatry on Thursday but noticed blood in his sock this morning. Pt states that he has been taking his diabetic medications as directed but states that his blood sugars are still running in 260s-270s.   The history is provided by the patient.  Foot Pain  Wound Check    Past Medical History:  Diagnosis Date   Diabetes mellitus without complication (HCC)    on Metformin  and insulin    Encounter for long-term (current) use of other medications    GERD (gastroesophageal reflux disease)    pt states this is monitored and no medication has been prescribed    Hyperlipidemia    pt states this is monitored and no medication has been prescribed    Hypertension    pt states this is monitored and no medication has been prescribed    Vitamin D  deficiency     Patient Active Problem List   Diagnosis Date Noted   Constipation 09/25/2023   Type 2 diabetes mellitus with hyperglycemia, without long-term current use of insulin  (HCC) 06/15/2023   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/15/2019   Diabetic neuropathy (HCC) 01/15/2019   Acute pain of right knee 01/11/2019   Trochanteric bursitis of right hip 01/11/2019   Hypertension    Hyperlipidemia    GERD (gastroesophageal reflux disease)    Vitamin D  deficiency    Medication management     Past Surgical History:  Procedure Laterality Date   ANKLE SURGERY Right 2009   FRACTURE SURGERY     LUMBAR LAMINECTOMY & DISCECTOMY  08/27/2018   OPEN REDUCTION INTERNAL FIXATION (ORIF) METACARPAL Left  05/25/2014   Procedure: OPEN REDUCTION INTERNAL FIXATION (ORIF) LEFT RING FINGER METACARPAL;  Surgeon: Arley Curia, MD;  Location: [facility];  Service: Orthopedics;  Laterality: Left;   SPINE SURGERY         Home Medications    Prior to Admission medications  Medication Sig Start Date End Date Taking? Authorizing Provider  Blood Glucose Monitoring Suppl DEVI 1 each by Does not apply route in the morning, at noon, and at bedtime. May substitute to any manufacturer covered by patient's insurance. 06/15/23  Yes Alvia Corean CROME, FNP  meloxicam  (MOBIC ) 7.5 MG tablet Take 1 tablet (7.5 mg total) by mouth 2 (two) times daily as needed for pain. 07/16/23  Yes Jerri Kay HERO, MD  metFORMIN  (GLUCOPHAGE -XR) 500 MG 24 hr tablet Take 2 tablets (1,000 mg total) by mouth 2 (two) times daily with a meal. 09/25/23  Yes Alvia Corean CROME, FNP  rosuvastatin  (CRESTOR ) 10 MG tablet TAKE 1 TABLET BY MOUTH EVERY DAY 03/28/24  Yes Alvia Corean CROME, FNP  valsartan -hydrochlorothiazide  (DIOVAN -HCT) 320-25 MG tablet TAKE 1 TABLET BY MOUTH EVERY DAY 03/28/24  Yes Alvia Corean CROME, FNP  Continuous Glucose Sensor (FREESTYLE LIBRE 3 PLUS SENSOR) MISC Inject 1 each into the skin continuous for 14 days. Change sensor every 15 days. 06/15/23 06/29/23  Alvia Corean CROME, FNP  diclofenac  Sodium (VOLTAREN ) 1 %  GEL Apply 2 g topically 4 (four) times daily. 07/02/23   Raspet, Erin K, PA-C  doxycycline (VIBRA-TABS) 100 MG tablet SMARTSIG:1 Tablet(s) By Mouth Every 12 Hours 06/08/23   [provider]  glipiZIDE  (GLUCOTROL ) 5 MG tablet Take 1 tablet (5 mg total) by mouth 2 (two) times daily before a meal. 09/25/23   Alvia Corean CROME, FNP  ibuprofen (ADVIL) 200 MG tablet Take 200 mg by mouth every 6 (six) hours as needed.    [provider]  silver  sulfADIAZINE  (SILVADENE ) 1 % cream APPLY 1 APPLICATION TOPICALLY DAILY 01/14/23   Standiford, Marsa FALCON, DPM    Family History Family  History  Problem Relation Age of Onset   Diabetes Mother    High blood pressure Mother     Social History Social History[1]   Allergies   Patient has no known allergies.   Review of Systems Review of Systems   Physical Exam Triage Vital Signs ED Triage Vitals  Encounter Vitals Group     BP 05/14/24 0854 (!) 168/84     Girls Systolic BP Percentile --      Girls Diastolic BP Percentile --      Boys Systolic BP Percentile --      Boys Diastolic BP Percentile --      Pulse Rate 05/14/24 0854 (!) 108     Resp 05/14/24 0854 18     Temp 05/14/24 0854 99.3 F (37.4 C)     Temp Source 05/14/24 0854 Oral     SpO2 05/14/24 0854 95 %     Weight --      Height --      Head Circumference --      Peak Flow --      Pain Score 05/14/24 0849 0     Pain Loc --      Pain Education --      Exclude from Growth Chart --    No data found.  Updated Vital Signs BP (!) 168/84 (BP Location: Right Arm)   Pulse (!) 108   Temp 99.3 F (37.4 C) (Oral)   Resp 18   SpO2 95%   Visual Acuity Right Eye Distance:   Left Eye Distance:   Bilateral Distance:    Right Eye Near:   Left Eye Near:    Bilateral Near:     Physical Exam Vitals and nursing note reviewed.  Constitutional:      General: He is not in acute distress.    Appearance: Normal appearance. He is not ill-appearing, toxic-appearing or diaphoretic.  Eyes:     General: No scleral icterus. Cardiovascular:     Rate and Rhythm: Normal rate and regular rhythm.     Heart sounds: Normal heart sounds.  Pulmonary:     Effort: Pulmonary effort is normal. No respiratory distress.     Breath sounds: Normal breath sounds. No wheezing or rhonchi.  Musculoskeletal:     Right foot: Swelling and tenderness present.     Comments: See picture attached  Skin:    General: Skin is warm.  Neurological:     Mental Status: He is alert and oriented to person, place, and time.  Psychiatric:        Mood and Affect: Mood normal.         Behavior: Behavior normal.      UC Treatments / Results  Labs (all labs ordered are listed, but only abnormal results are displayed) Labs Reviewed  GLUCOSE, POCT (MANUAL RESULT ENTRY)  EKG   Radiology No results found.  Procedures Procedures (including critical care time)  Medications Ordered in UC Medications - No data to display  Initial Impression / Assessment and Plan / UC Course  I have reviewed the triage vital signs and the nursing notes.  Pertinent labs & imaging results that were available during my care of the patient were reviewed by me and considered in my medical decision making (see chart for details).      Final Clinical Impressions(s) / UC Diagnoses   Final diagnoses:  Diabetic ulcer of toe of right foot associated with type 2 diabetes mellitus, unspecified ulcer stage [facility])     Discharge Instructions      Concern for infection in your right foot due to discoloration and uncontrolled blood sugar despite taking medicines regularly. We need some imaging studies to determine that extent of damage. Please report to ED for further evaluation.     ED Prescriptions   None    PDMP not reviewed this encounter.    [1]  Social History Tobacco Use   Smoking status: Never    Passive exposure: Never   Smokeless tobacco: Never  Vaping Use   Vaping status: Never Used  Substance Use Topics   Alcohol use: Yes    Alcohol/week: 3.0 - 4.0 standard drinks of alcohol    Types: 3 - 4 Cans of beer per week   Drug use: No     William Corean BROCKS, PA-C 05/14/24 9071  "

## 2024-05-14 NOTE — ED Notes (Signed)
 Applied ABD pad and wrap to right foot.

## 2024-05-14 NOTE — Discharge Instructions (Signed)
 Concern for infection in your right foot due to discoloration and uncontrolled blood sugar despite taking medicines regularly. We need some imaging studies to determine that extent of damage. Please report to ED for further evaluation.

## 2024-05-14 NOTE — ED Notes (Signed)
Provided urinal. °

## 2024-05-14 NOTE — ED Triage Notes (Signed)
 Pt reports his right foot has been feeling swollen on the bottom for about a month. He has an appointment with his podiatrist on Thursday. This morning he noticed he had blood on his sock. This morning it started bleeding again. Pt ambulatory with walker. Reports history of drop foot and diabetes. Denies pain. Blood glucose last nght was 260-270 per pt report. He has continuous glucose sensor.

## 2024-05-19 ENCOUNTER — Encounter: Payer: Self-pay | Admitting: Podiatry

## 2024-05-19 ENCOUNTER — Ambulatory Visit: Payer: Self-pay

## 2024-05-19 ENCOUNTER — Ambulatory Visit: Payer: Self-pay | Admitting: Podiatry

## 2024-05-19 VITALS — Ht 68.0 in | Wt 194.0 lb

## 2024-05-19 DIAGNOSIS — E11621 Type 2 diabetes mellitus with foot ulcer: Secondary | ICD-10-CM

## 2024-05-19 DIAGNOSIS — L97411 Non-pressure chronic ulcer of right heel and midfoot limited to breakdown of skin: Secondary | ICD-10-CM

## 2024-05-19 LAB — CULTURE, BLOOD (ROUTINE X 2)
Culture: NO GROWTH
Culture: NO GROWTH
Special Requests: ADEQUATE
Special Requests: ADEQUATE

## 2024-05-19 MED ORDER — MUPIROCIN 2 % EX OINT: 1.0000 | TOPICAL_OINTMENT | Freq: Every day | CUTANEOUS | 2 refills | Status: AC

## 2024-05-20 NOTE — Progress Notes (Signed)
 "  Subjective:  Patient ID: William Moran, male    DOB: 03-02-1971,  MRN: [id number]  Chief Complaint  Patient presents with   Foot Pain    RM 21 Patient is here for possible ulcer and blister of the right foot-plantar aspect.    Discussed the use of AI scribe software for clinical note transcription with the patient, who gave verbal consent to proceed.  History of Present Illness William Moran is a 54 year old male with type 2 diabetes mellitus, bilateral foot drop, and diabetic neuropathy who presents for evaluation of a right foot ulcer and blood blister.  A blood blister developed on the right foot approximately five days prior to presentation. He initially sought care at urgent care and was referred to the hospital due to worsening symptoms. At the hospital, he underwent radiographic and laboratory evaluation, received intravenous antibiotics, and was instructed to keep the area clean and wrapped daily. He is currently taking oral antibiotics, possibly doxycycline 100 mg twice daily. He has not used topical antibiotic ointment. He reports difficulty offloading pressure from the right foot due to caregiving responsibilities, and stepping down on the affected foot resulted in bleeding from the wound. He denies fever, purulent drainage, malodor, or erythema since the initial episode.  He has bilateral foot drop following back surgery in 2021 and uses a brace on the affected side. He continues to limp, which he feels increases pressure on his feet. He previously had a wound on the contralateral foot, which has healed. He notes persistent limping and imbalance, particularly when the ball of the right foot feels swollen. He describes swelling and a sensation of imbalance at the ball of the right foot, which is less severe than the current ulcer but continues to affect his gait.  He has poorly controlled diabetes, with a most recent hemoglobin A1c of 9.7 approximately five months ago. He  takes metformin  2000 mg daily and an oral insulin  agent, but is unable to recall the name. He is not using injectable insulin . His blood glucose was 308 mg/dL during his recent hospital visit. He has neuropathy in both feet, resulting in decreased sensation and unawareness of foot wounds.      Objective:    Physical Exam VASCULAR: DP and PT pulse palpable. Foot is warm and well-perfused. Capillary fill time is brisk. Edema of the right foot. DERMATOLOGIC: Partial thickness ulcer on the right foot with partial skin breakdown of the dermis. Mild serous exudate, dried blood, and blood blister from plantar metatarsal area to medial first MTP joint. No purulent drainage. No cellulitis. NEUROLOGIC: Normal sensation to light touch and pressure. No paresthesias on examination. ORTHOPEDIC: Smooth pain-free range of motion of all examined joints. No ecchymosis or bruising. No gross deformity. No pain to palpation.   No images are attached to the encounter.    Results Labs Glucose (05/14/2024): 308 mg/dL (hyperglycemia) WBC (98/75/7973): 10.6 (mild leukocytosis), no left shift  Radiology Right foot radiographs (05/14/2024): No osteolysis or epidermolysis; no evidence of osteomyelitis or deep tissue involvement  Debridement of right foot ulcer Devitalized epidermal and dermal tissue debrided from partial thickness ulcer of right foot. Mild serous exudate and dried blood present. No purulent drainage, no exposure of tendon, bone, or joint. Blood blister extending from plantar metatarsal area to medial first MTP joint.  Dispensing of surgical shoe with dancer's pad Surgical shoe with dancer's pad dispensed to offload pressure from medial first MTP joint of right foot.   Assessment:  ICD-10-CM   1. Diabetic ulcer of right midfoot associated with type 2 diabetes mellitus, limited to breakdown of skin [facility])  E11.621    L97.411         Plan:  Patient was evaluated and treated and all  questions answered.  Assessment and Plan Assessment & Plan Diabetic foot ulcer, right foot Partial thickness ulcer involving epidermal and partial dermal layers, with mild serous exudate, dried blood, and blood blister extending from the plantar metatarsal area to the medial first MTP joint. No evidence of deep tissue, tendon, bone, or joint involvement. No purulent drainage or cellulitis. Radiographs negative for osteolysis or deep infection. Mild leukocytosis and hyperglycemia present. Ulcer etiology likely due to pressure from foot drop and neuropathy, with delayed healing secondary to poorly controlled diabetes. Early intervention has limited progression to superficial layers. Gradual healing anticipated with offloading and wound care; infection and delayed healing remain risks due to underlying neuropathy and diabetes. - Debrided devitalized epidermal and dermal tissue to facilitate drainage and healing. - Continued oral Augmentin  and instructed him to complete the course. - Prescribed mupirocin  ointment for daily application to the wound. - Dispensed surgical shoe with dancer's pad to offload pressure from medial first MTP joint. - Permitted full weight bearing with surgical shoe and pad; instructed to limit pressure on the right foot as much as possible. - Advised daily wound care: remove bandage, wash with antibacterial soap, dry, apply thin layer of mupirocin , re-bandage with gauze or Band-Aid, and keep a sock over the dressing. - Scheduled follow-up in three weeks for reevaluation and further wound care. - Provided education on signs of worsening infection and instructed to seek emergency care if these occur.  Type 2 diabetes mellitus Chronic poorly controlled type 2 diabetes mellitus, last A1c 9.7% five months ago, currently managed with metformin  and oral insulin  agent. Hyperglycemia noted on recent labs. Poor glycemic control is contributing to delayed wound healing and increased  infection risk. - Reviewed current diabetes medications and confirmed adherence to metformin  and oral insulin  agent. - Reinforced importance of glycemic control for wound healing and overall health.  Diabetic neuropathy Chronic diabetic neuropathy resulting in loss of protective sensation in the feet, increasing risk for ulceration and delayed recognition of injury. Neuropathy is a significant factor in the development and chronicity of the current foot ulcer. - Discussed the role of neuropathy in ulcer formation and emphasized daily foot checks and use of protective footwear.  Bilateral foot drop Chronic bilateral foot drop, worse on the right, secondary to prior back surgery. Contributes to abnormal gait, increased plantar pressure, and risk of ulceration. Delay in obtaining brace for right side resulted in prolonged limping and increased pressure on the right foot. - Reviewed current use of braces and confirmed adherence. - Dispensed surgical shoe with offloading pad for the right foot to reduce pressure and promote healing. - Discussed potential benefit of physical therapy for strengthening and gait training after ulcer healing.      Return in about 3 weeks (around 06/09/2024) for wound care.   "

## 2024-05-24 ENCOUNTER — Other Ambulatory Visit: Payer: Self-pay | Admitting: Family Medicine

## 2024-05-24 DIAGNOSIS — E782 Mixed hyperlipidemia: Secondary | ICD-10-CM

## 2024-06-09 ENCOUNTER — Ambulatory Visit: Admitting: Podiatry
